# Patient Record
Sex: Female | Born: 1950 | Race: White | Hispanic: Yes | Marital: Single | Smoking: Never smoker
Health system: Southern US, Community
[De-identification: ages and names within clinical notes are randomized; demographics above are authoritative.]

## PROBLEM LIST (undated history)

## (undated) DIAGNOSIS — I10 Essential (primary) hypertension: Secondary | ICD-10-CM

## (undated) DIAGNOSIS — R011 Cardiac murmur, unspecified: Secondary | ICD-10-CM

## (undated) DIAGNOSIS — E7800 Pure hypercholesterolemia, unspecified: Secondary | ICD-10-CM

## (undated) DIAGNOSIS — I350 Nonrheumatic aortic (valve) stenosis: Secondary | ICD-10-CM

## (undated) DIAGNOSIS — R000 Tachycardia, unspecified: Secondary | ICD-10-CM

## (undated) HISTORY — PX: SHOULDER SURGERY: SHX246

## (undated) HISTORY — DX: Nonrheumatic aortic (valve) stenosis: I35.0

## (undated) HISTORY — PX: CARPAL TUNNEL RELEASE: SHX101

---
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
## 2020-04-07 ENCOUNTER — Inpatient Hospital Stay (HOSPITAL_BASED_OUTPATIENT_CLINIC_OR_DEPARTMENT_OTHER): Payer: Medicare Other

## 2020-04-07 ENCOUNTER — Inpatient Hospital Stay (HOSPITAL_COMMUNITY): Payer: Medicare Other

## 2020-04-07 ENCOUNTER — Other Ambulatory Visit: Payer: Self-pay

## 2020-04-07 ENCOUNTER — Emergency Department (HOSPITAL_COMMUNITY): Payer: Medicare Other

## 2020-04-07 ENCOUNTER — Observation Stay (HOSPITAL_COMMUNITY)
Admission: EM | Admit: 2020-04-07 | Discharge: 2020-04-08 | Disposition: A | Discharge status: Home / Self Care | Payer: Medicare Other | Attending: Internal Medicine | Admitting: Internal Medicine

## 2020-04-07 ENCOUNTER — Encounter (HOSPITAL_COMMUNITY): Payer: Self-pay | Admitting: Emergency Medicine

## 2020-04-07 DIAGNOSIS — R002 Palpitations: Secondary | ICD-10-CM | POA: Diagnosis not present

## 2020-04-07 DIAGNOSIS — Z7289 Other problems related to lifestyle: Secondary | ICD-10-CM

## 2020-04-07 DIAGNOSIS — I350 Nonrheumatic aortic (valve) stenosis: Secondary | ICD-10-CM | POA: Diagnosis not present

## 2020-04-07 DIAGNOSIS — Z789 Other specified health status: Secondary | ICD-10-CM

## 2020-04-07 DIAGNOSIS — E7800 Pure hypercholesterolemia, unspecified: Secondary | ICD-10-CM | POA: Diagnosis not present

## 2020-04-07 DIAGNOSIS — Z6831 Body mass index (BMI) 31.0-31.9, adult: Secondary | ICD-10-CM | POA: Diagnosis not present

## 2020-04-07 DIAGNOSIS — Z79899 Other long term (current) drug therapy: Secondary | ICD-10-CM | POA: Insufficient documentation

## 2020-04-07 DIAGNOSIS — Z7982 Long term (current) use of aspirin: Secondary | ICD-10-CM | POA: Diagnosis not present

## 2020-04-07 DIAGNOSIS — R7401 Elevation of levels of liver transaminase levels: Secondary | ICD-10-CM | POA: Insufficient documentation

## 2020-04-07 DIAGNOSIS — I351 Nonrheumatic aortic (valve) insufficiency: Secondary | ICD-10-CM

## 2020-04-07 DIAGNOSIS — I161 Hypertensive emergency: Secondary | ICD-10-CM | POA: Diagnosis not present

## 2020-04-07 DIAGNOSIS — R200 Anesthesia of skin: Secondary | ICD-10-CM | POA: Diagnosis not present

## 2020-04-07 DIAGNOSIS — R531 Weakness: Secondary | ICD-10-CM | POA: Diagnosis present

## 2020-04-07 DIAGNOSIS — I63312 Cerebral infarction due to thrombosis of left middle cerebral artery: Secondary | ICD-10-CM

## 2020-04-07 DIAGNOSIS — R42 Dizziness and giddiness: Secondary | ICD-10-CM

## 2020-04-07 DIAGNOSIS — E785 Hyperlipidemia, unspecified: Secondary | ICD-10-CM | POA: Insufficient documentation

## 2020-04-07 DIAGNOSIS — R55 Syncope and collapse: Secondary | ICD-10-CM | POA: Insufficient documentation

## 2020-04-07 DIAGNOSIS — R079 Chest pain, unspecified: Secondary | ICD-10-CM | POA: Diagnosis not present

## 2020-04-07 DIAGNOSIS — E669 Obesity, unspecified: Secondary | ICD-10-CM | POA: Diagnosis not present

## 2020-04-07 DIAGNOSIS — Z20822 Contact with and (suspected) exposure to covid-19: Secondary | ICD-10-CM | POA: Insufficient documentation

## 2020-04-07 DIAGNOSIS — R4781 Slurred speech: Secondary | ICD-10-CM | POA: Insufficient documentation

## 2020-04-07 DIAGNOSIS — I639 Cerebral infarction, unspecified: Principal | ICD-10-CM | POA: Insufficient documentation

## 2020-04-07 DIAGNOSIS — I10 Essential (primary) hypertension: Secondary | ICD-10-CM | POA: Diagnosis not present

## 2020-04-07 HISTORY — DX: Pure hypercholesterolemia, unspecified: E78.00

## 2020-04-07 HISTORY — DX: Essential (primary) hypertension: I10

## 2020-04-07 HISTORY — DX: Tachycardia, unspecified: R00.0

## 2020-04-07 LAB — RESPIRATORY PANEL BY RT PCR (FLU A&B, COVID)
Influenza A by PCR: NEGATIVE
Influenza B by PCR: NEGATIVE
SARS Coronavirus 2 by RT PCR: NEGATIVE

## 2020-04-07 LAB — COMPREHENSIVE METABOLIC PANEL
ALT: 36 U/L (ref 0–44)
AST: 46 U/L — ABNORMAL HIGH (ref 15–41)
Albumin: 4.4 g/dL (ref 3.5–5.0)
Alkaline Phosphatase: 76 U/L (ref 38–126)
Anion gap: 12 (ref 5–15)
BUN: 12 mg/dL (ref 8–23)
CO2: 22 mmol/L (ref 22–32)
Calcium: 9.8 mg/dL (ref 8.9–10.3)
Chloride: 102 mmol/L (ref 98–111)
Creatinine, Ser: 0.88 mg/dL (ref 0.44–1.00)
GFR calc Af Amer: >60 mL/min (ref >60)
GFR calc non Af Amer: >60 mL/min (ref >60)
Glucose, Bld: 102 mg/dL — ABNORMAL HIGH (ref 70–99)
Potassium: 3.8 mmol/L (ref 3.5–5.1)
Sodium: 136 mmol/L (ref 135–145)
Total Bilirubin: 0.7 mg/dL (ref 0.3–1.2)
Total Protein: 8 g/dL (ref 6.5–8.1)

## 2020-04-07 LAB — URINALYSIS, ROUTINE W REFLEX MICROSCOPIC
Bilirubin Urine: NEGATIVE
Glucose, UA: NEGATIVE mg/dL
Hgb urine dipstick: NEGATIVE
Ketones, ur: NEGATIVE mg/dL
Leukocytes,Ua: NEGATIVE
Nitrite: NEGATIVE
Protein, ur: NEGATIVE mg/dL
Specific Gravity, Urine: 1.006 (ref 1.005–1.030)
pH: 5 (ref 5.0–8.0)

## 2020-04-07 LAB — DIFFERENTIAL
Abs Immature Granulocytes: 0.03 10*3/uL (ref 0.00–0.07)
Basophils Absolute: 0 10*3/uL (ref 0.0–0.1)
Basophils Relative: 0 %
Eosinophils Absolute: 0.2 10*3/uL (ref 0.0–0.5)
Eosinophils Relative: 3 %
Immature Granulocytes: 0 %
Lymphocytes Relative: 29 %
Lymphs Abs: 2.4 10*3/uL (ref 0.7–4.0)
Monocytes Absolute: 0.7 10*3/uL (ref 0.1–1.0)
Monocytes Relative: 9 %
Neutro Abs: 4.9 10*3/uL (ref 1.7–7.7)
Neutrophils Relative %: 59 %

## 2020-04-07 LAB — ECHOCARDIOGRAM COMPLETE
Height: 61 in
Weight: 2645.52 oz

## 2020-04-07 LAB — LIPID PANEL
Cholesterol: 312 mg/dL — ABNORMAL HIGH (ref 0–200)
HDL: 57 mg/dL (ref >40)
LDL Cholesterol: UNDETERMINED mg/dL (ref 0–99)
Total CHOL/HDL Ratio: 5.5 RATIO
Triglycerides: 787 mg/dL — ABNORMAL HIGH (ref <150)
VLDL: UNDETERMINED mg/dL (ref 0–40)

## 2020-04-07 LAB — CBC
HCT: 39.9 % (ref 36.0–46.0)
Hemoglobin: 14 g/dL (ref 12.0–15.0)
MCH: 29.9 pg (ref 26.0–34.0)
MCHC: 35.1 g/dL (ref 30.0–36.0)
MCV: 85.1 fL (ref 80.0–100.0)
Platelets: 172 10*3/uL (ref 150–400)
RBC: 4.69 MIL/uL (ref 3.87–5.11)
RDW: 11.3 % — ABNORMAL LOW (ref 11.5–15.5)
WBC: 8.2 10*3/uL (ref 4.0–10.5)
nRBC: 0 % (ref 0.0–0.2)

## 2020-04-07 LAB — I-STAT CHEM 8, ED
BUN: 14 mg/dL (ref 8–23)
Calcium, Ion: 1.15 mmol/L (ref 1.15–1.40)
Chloride: 104 mmol/L (ref 98–111)
Creatinine, Ser: 0.8 mg/dL (ref 0.44–1.00)
Glucose, Bld: 95 mg/dL (ref 70–99)
HCT: 41 % (ref 36.0–46.0)
Hemoglobin: 13.9 g/dL (ref 12.0–15.0)
Potassium: 3.7 mmol/L (ref 3.5–5.1)
Sodium: 137 mmol/L (ref 135–145)
TCO2: 24 mmol/L (ref 22–32)

## 2020-04-07 LAB — PROTIME-INR
INR: 1 (ref 0.8–1.2)
Prothrombin Time: 13.2 seconds (ref 11.4–15.2)

## 2020-04-07 LAB — RAPID URINE DRUG SCREEN, HOSP PERFORMED
Amphetamines: NOT DETECTED
Barbiturates: NOT DETECTED
Benzodiazepines: NOT DETECTED
Cocaine: NOT DETECTED
Opiates: NOT DETECTED
Tetrahydrocannabinol: NOT DETECTED

## 2020-04-07 LAB — MAGNESIUM: Magnesium: 2.2 mg/dL (ref 1.7–2.4)

## 2020-04-07 LAB — HEMOGLOBIN A1C
Hgb A1c MFr Bld: 5.3 % (ref 4.8–5.6)
Mean Plasma Glucose: 105.41 mg/dL

## 2020-04-07 LAB — CBG MONITORING, ED: Glucose-Capillary: 93 mg/dL (ref 70–99)

## 2020-04-07 LAB — LDL CHOLESTEROL, DIRECT: Direct LDL: 126.6 mg/dL — ABNORMAL HIGH (ref 0–99)

## 2020-04-07 LAB — APTT: aPTT: 32 seconds (ref 24–36)

## 2020-04-07 LAB — HIV ANTIBODY (ROUTINE TESTING W REFLEX): HIV Screen 4th Generation wRfx: NONREACTIVE

## 2020-04-07 LAB — ETHANOL: Alcohol, Ethyl (B): <10 mg/dL (ref <10)

## 2020-04-07 IMAGING — CT CT HEAD CODE STROKE
2 of 3 series · 14 of 47 positions shown, 17 images · non-contrast
Comparison: None.

CLINICAL DATA: Code stroke. Right-sided weakness and slurred speech

EXAM:
CT HEAD WITHOUT CONTRAST
TECHNIQUE: Contiguous axial images were obtained from the base of the skull
through the vertex without intravenous contrast.

[Series 3: head 5.0 st · axial · 0.43mm/px · z∈[-127,+13]mm · 11 of 34 slices shown, 14 images]
[im 3/34  brain]
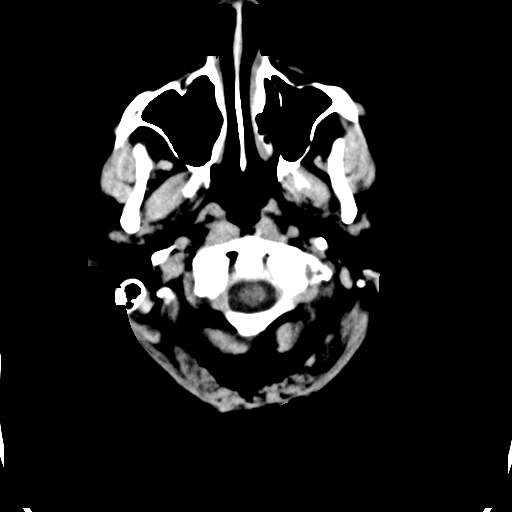
[im 3/34  bone]
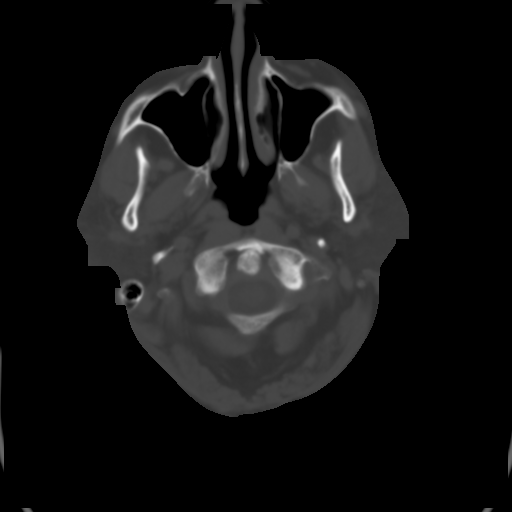
[im 5/34  brain]
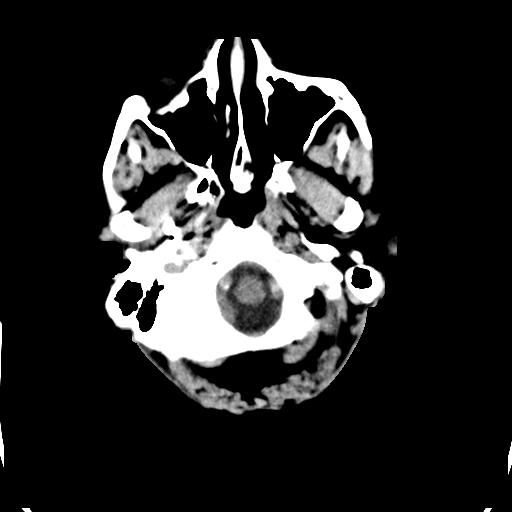
[im 8/34  brain]
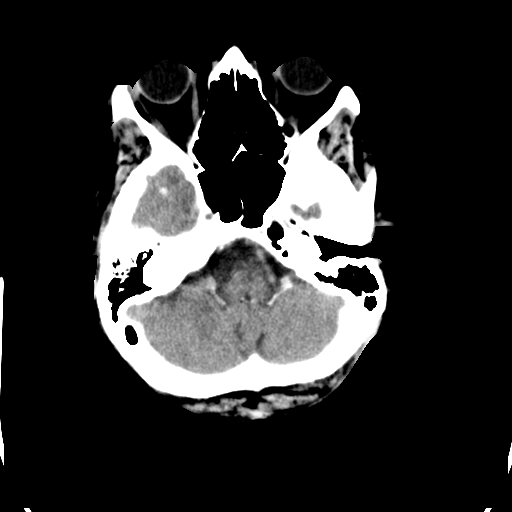
[im 11/34  brain]
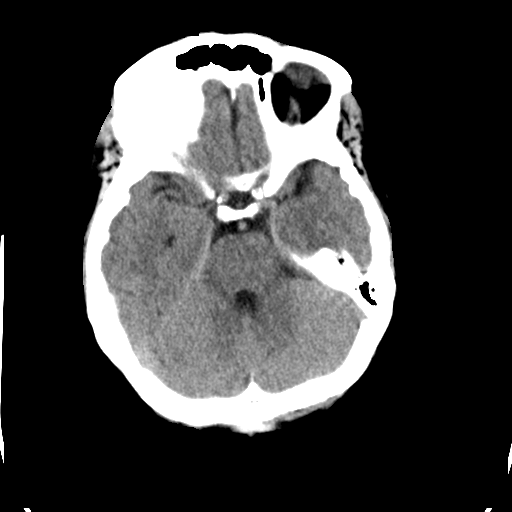
[im 14/34  brain]
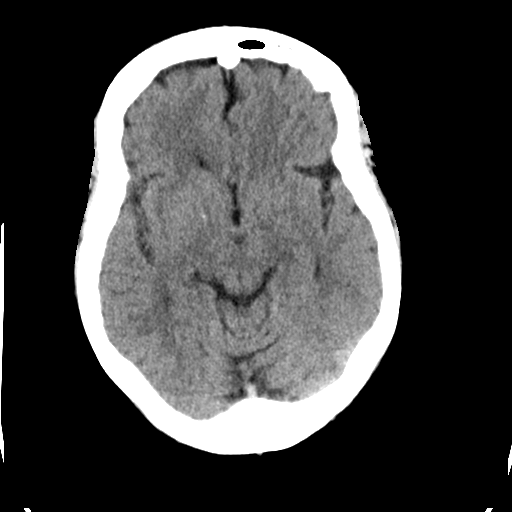
[im 14/34  bone]
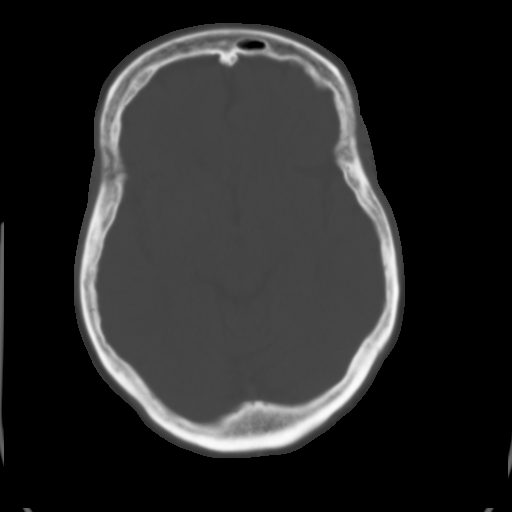
[im 18/34  brain]
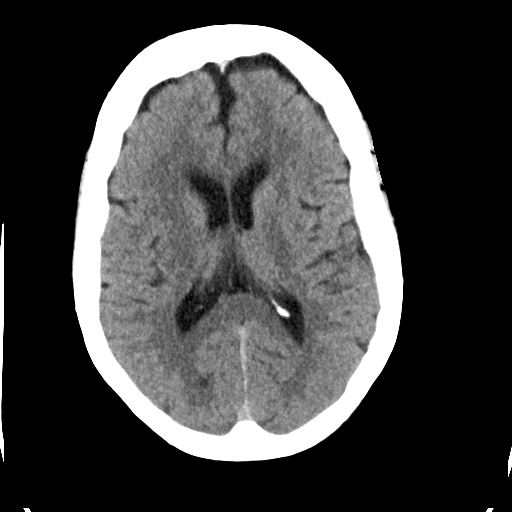
[im 20/34  brain]
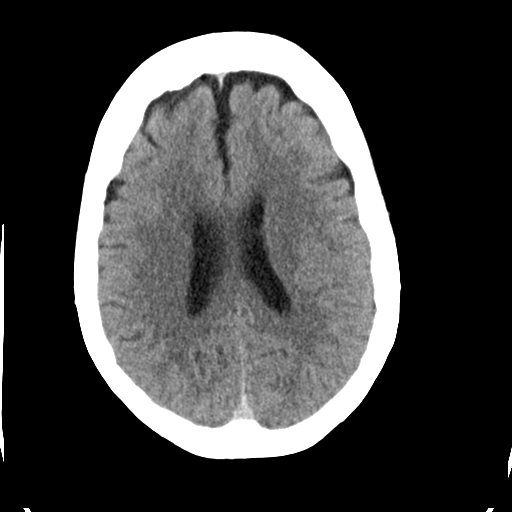
[im 23/34  brain]
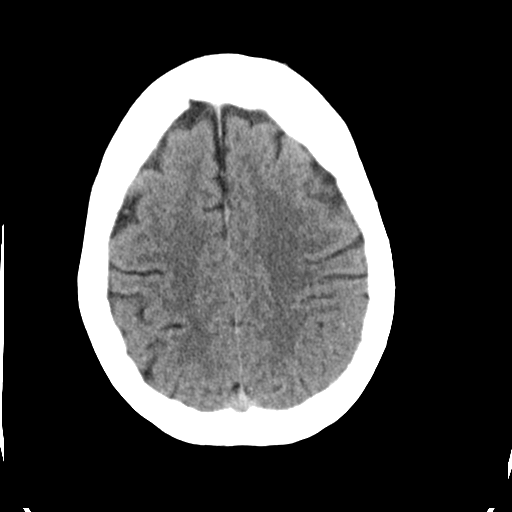
[im 26/34  brain]
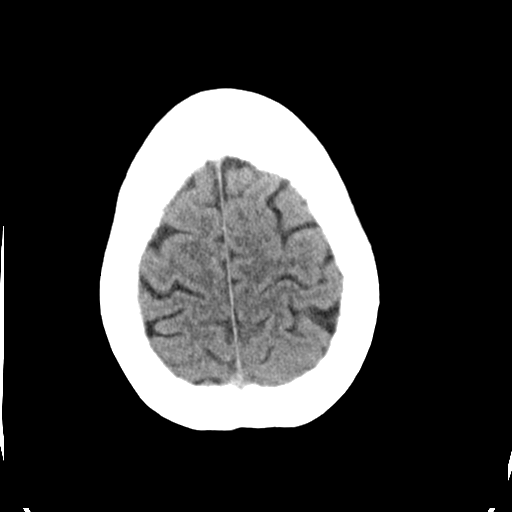
[im 26/34  bone]
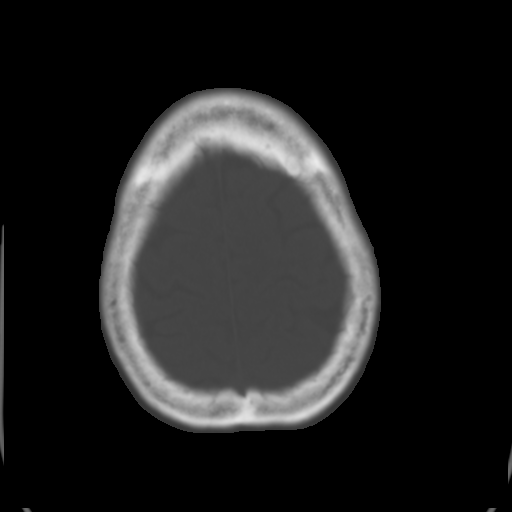
[im 29/34  brain]
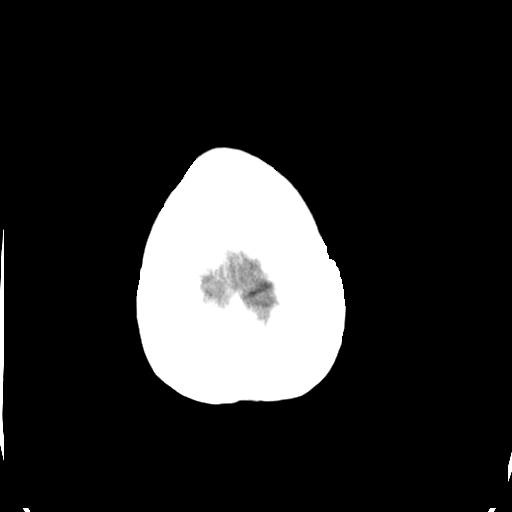
[im 31/34  brain]
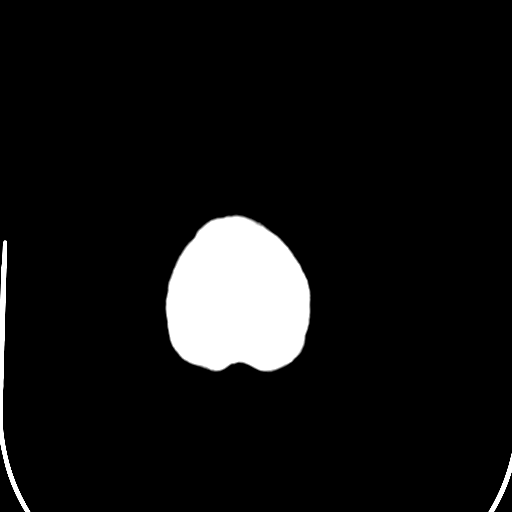

[Series 5: head 3.0 cor st · coronal · 0.33mm/px · 3 of 74 slices shown]
[im 25/74  brain]
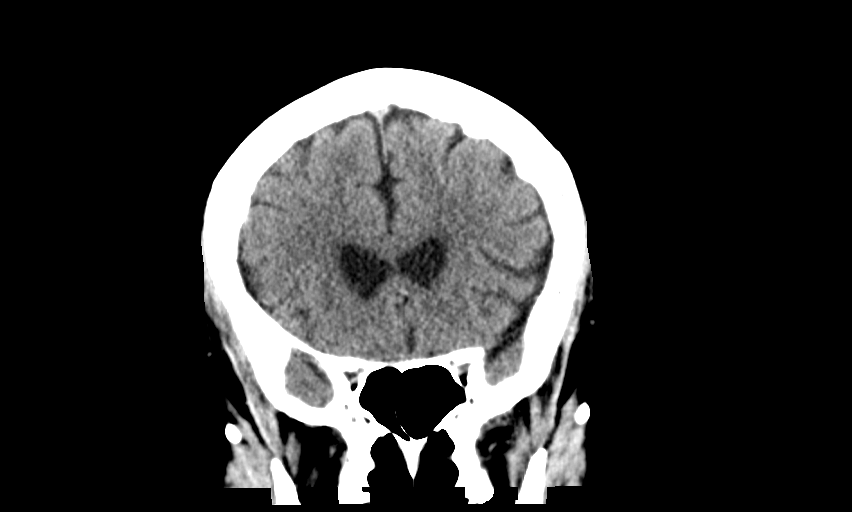
[im 33/74  brain]
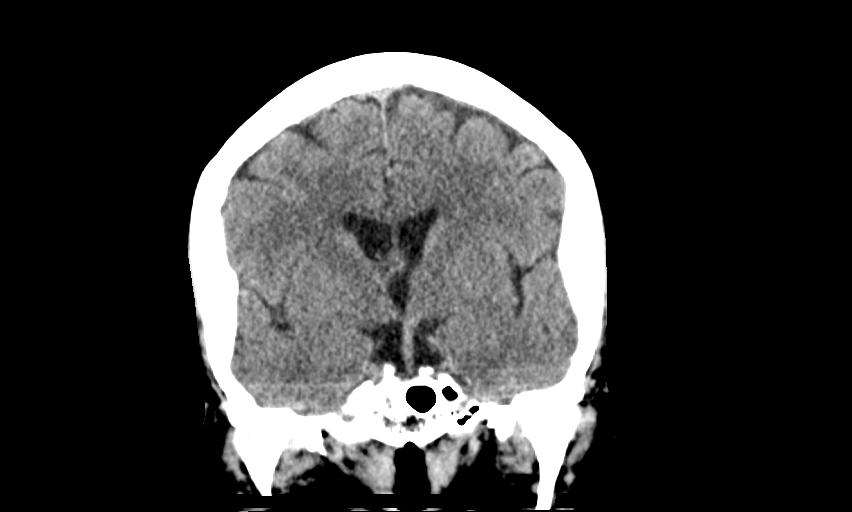
[im 41/74  brain]
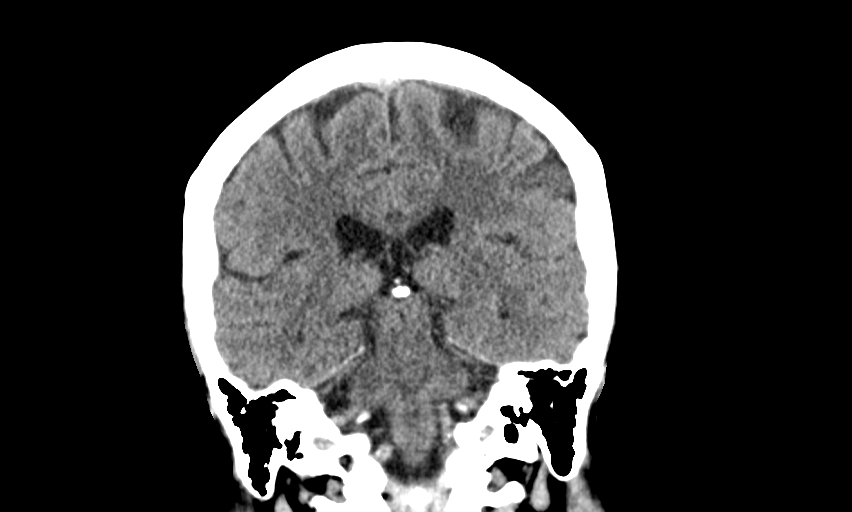

[14 of 47 positions shown; findings below may reference images not displayed]

FINDINGS: Brain: There is no mass, hemorrhage or extra-axial collection. The
size and configuration of the ventricles and extra-axial CSF spaces
are normal. The brain parenchyma is normal, without evidence of
acute or chronic infarction.

Vascular: No abnormal hyperdensity of the major intracranial
arteries or dural venous sinuses. No intracranial atherosclerosis.

Skull: The visualized skull base, calvarium and extracranial soft
tissues are normal.

Sinuses/Orbits: No fluid levels or advanced mucosal thickening of
the visualized paranasal sinuses. No mastoid or middle ear effusion.
The orbits are normal.

ASPECTS (Alberta Stroke Program Early CT Score)

- Ganglionic level infarction (caudate, lentiform nuclei, internal
capsule, insula, M1-M3 cortex): 7

- Supraganglionic infarction (M4-M6 cortex): 3

Total score (0-10 with 10 being normal): 10
IMPRESSION: 1. Normal head CT.
2. ASPECTS is 10.
* These results were communicated to Dr. ROVANI at [DATE] on [DATE] by text page via the [employer] messaging system.

## 2020-04-07 IMAGING — CR DG CHEST 2V
2 series · 2 of 2 positions shown · non-contrast
Comparison: None.

CLINICAL DATA: Slurred speech lightheaded

EXAM:
CHEST - 2 VIEW

[chest lat]
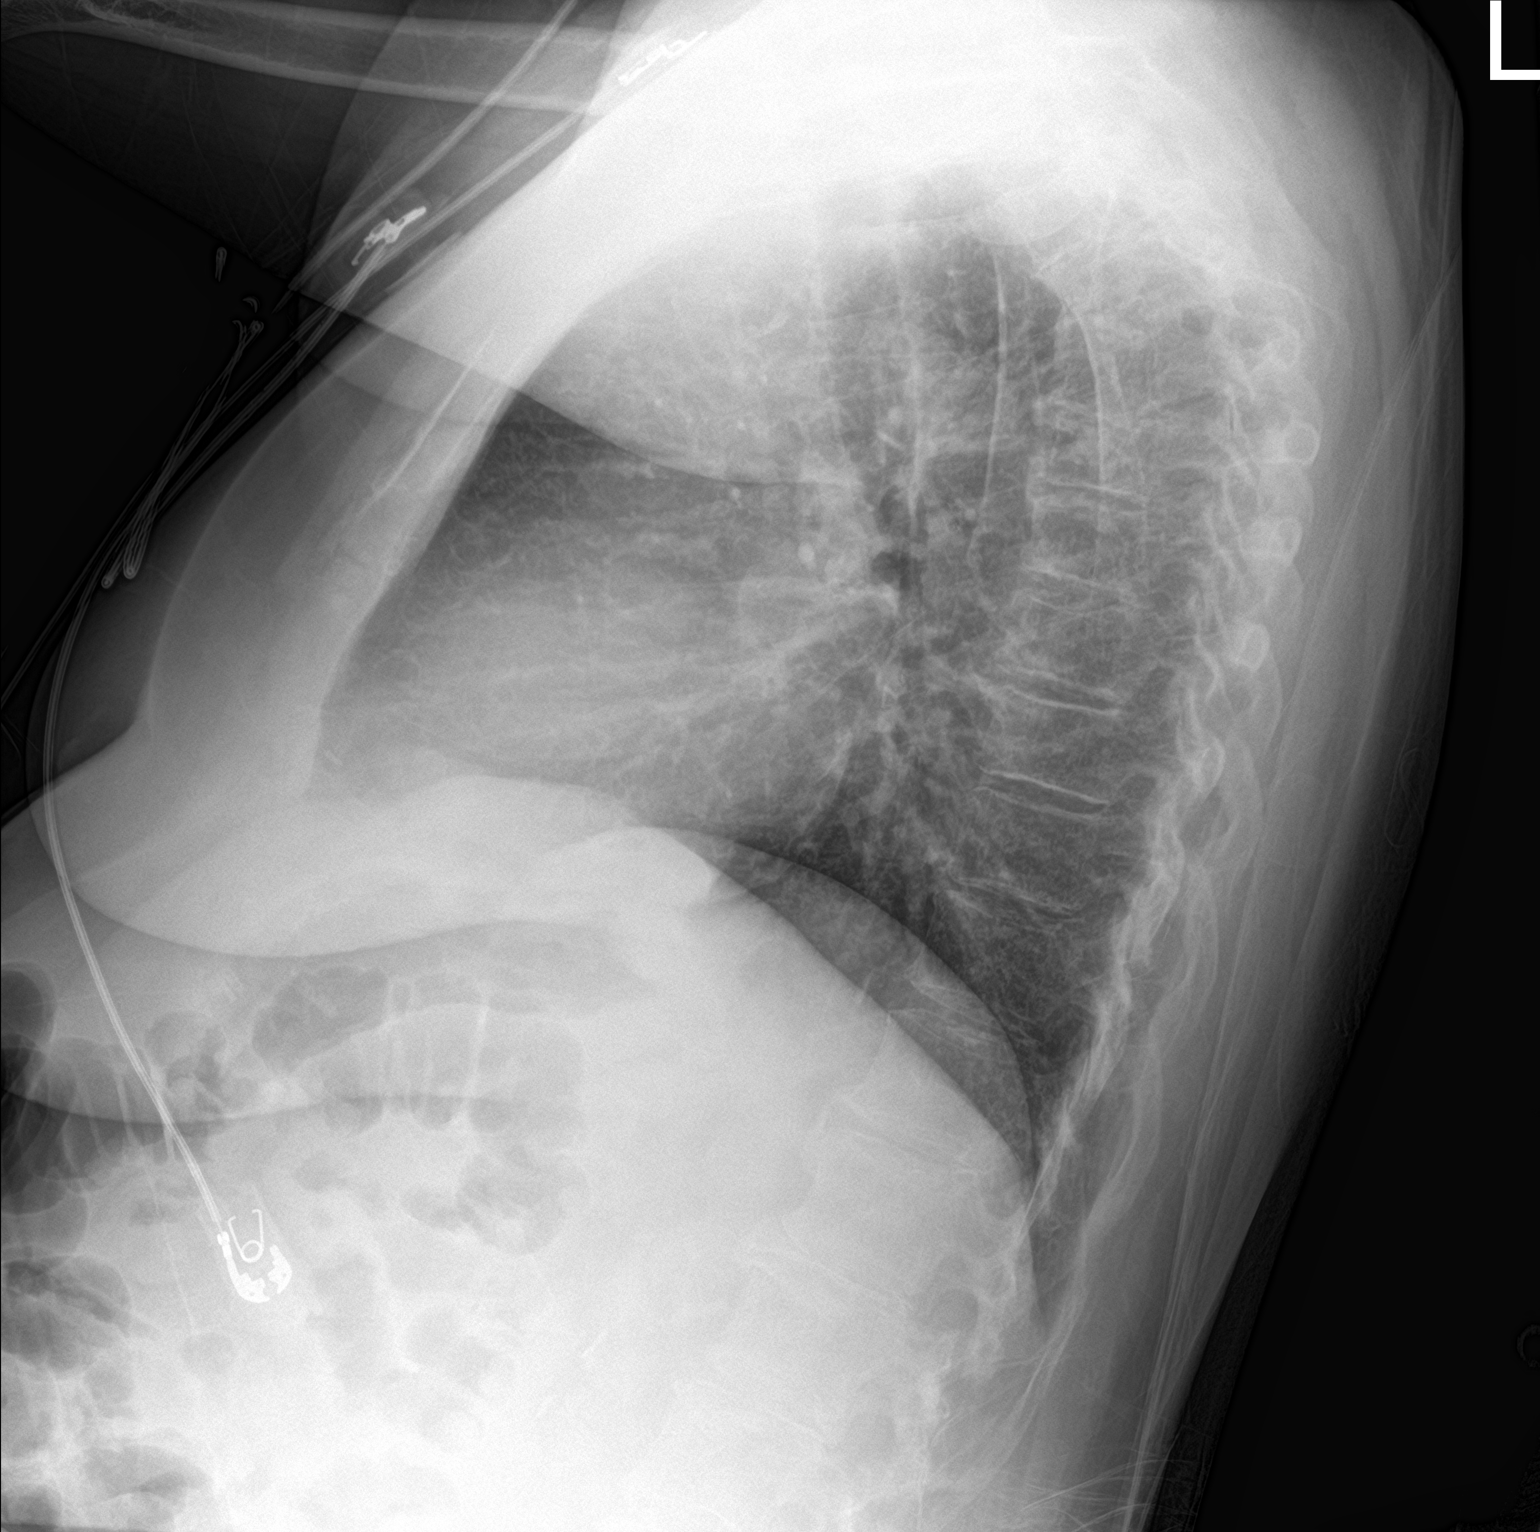

[chest ap]
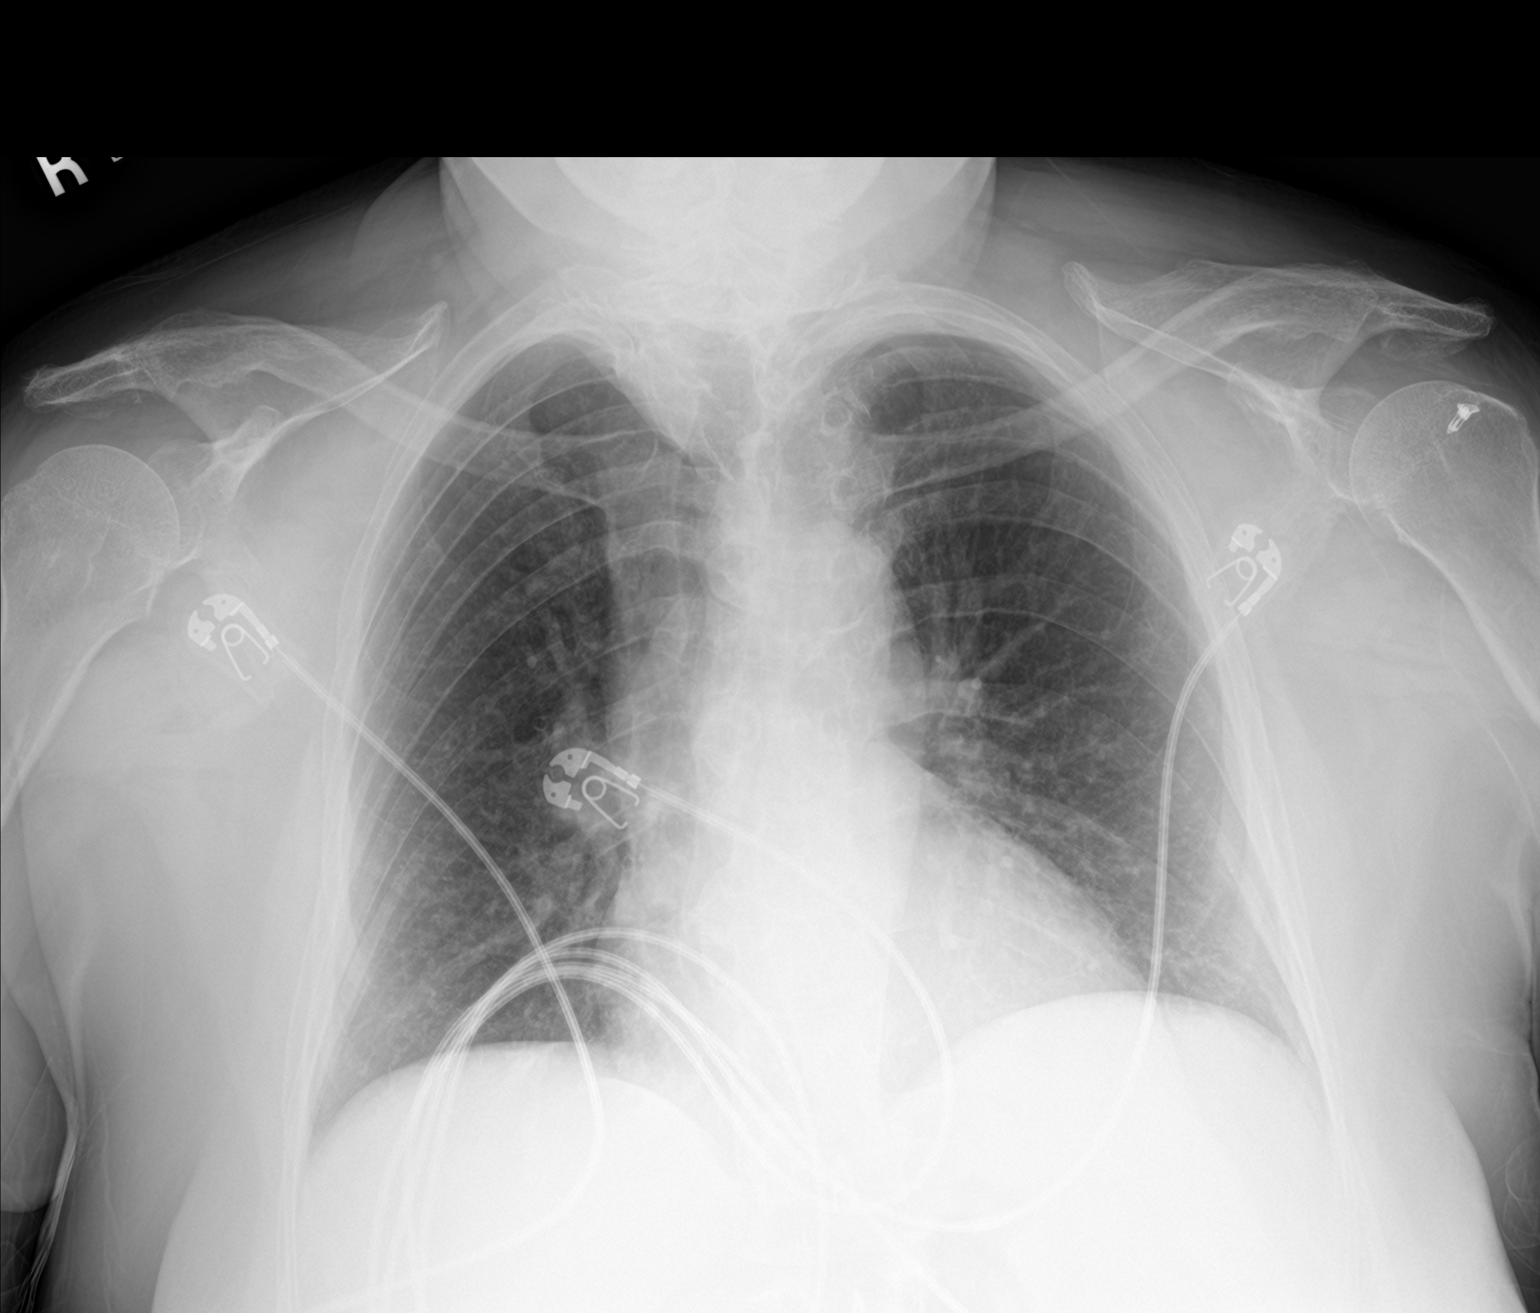

[2 of 2 positions shown; findings below may reference images not displayed]

FINDINGS: The heart size and mediastinal contours are within normal limits.
Both lungs are clear. Likely chronic mild interstitial prominence.
No pleural effusion or pneumothorax. The visualized skeletal
structures are unremarkable.
IMPRESSION: No acute process in the chest.

## 2020-04-07 IMAGING — MR MR MRA NECK WO/W CM
7 of 9 series · 42 of 48 positions shown · IV contrast (Gadavist)
Comparison: None.

CLINICAL DATA: Right-sided weakness with slurred speech

EXAM:
MR HEAD WITHOUT CONTRAST
MR CIRCLE OF WILLIS WITHOUT CONTRAST
MRA OF THE NECK WITHOUT AND WITH CONTRAST
TECHNIQUE: Multiplanar, multiecho pulse sequences of the brain, circle of
willis and surrounding structures were obtained without intravenous
contrast. Angiographic images of the neck were obtained using MRA
technique without and with intravenous contrast.
CONTRAST:  7.5mL GADAVIST GADOBUTROL 1 MMOL/ML IV SOLN

[Series 7: tof_fl3d_tra_iso · axial · 0.6mm · 0.52mm/px · z∈[-190,-112]mm · 8 of 133 slices shown]
[im 1/133]
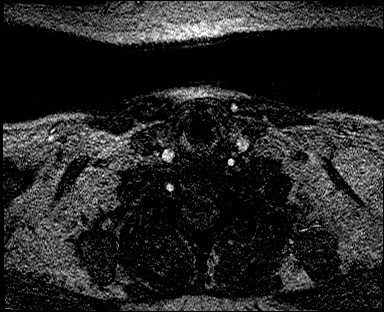
[im 19/133]
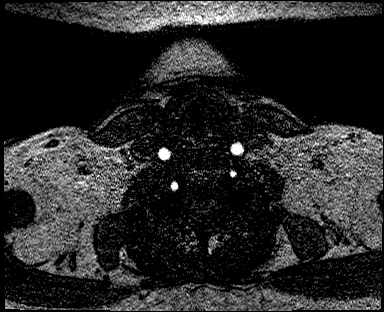
[im 38/133]
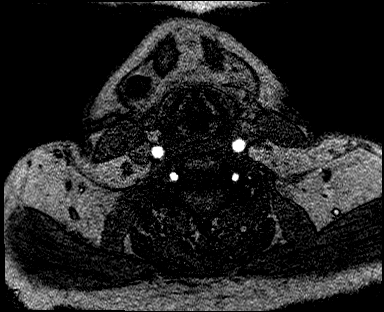
[im 57/133]
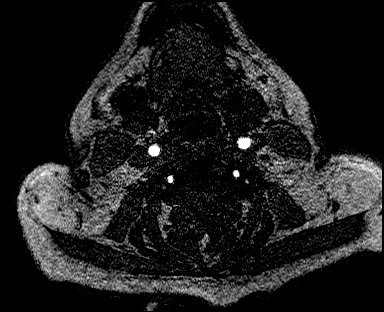
[im 76/133]
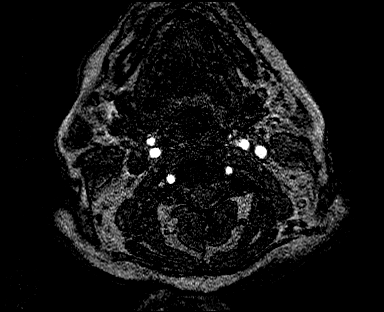
[im 95/133]
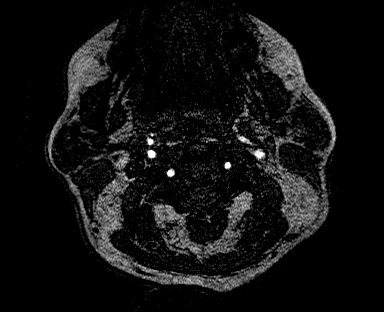
[im 114/133]
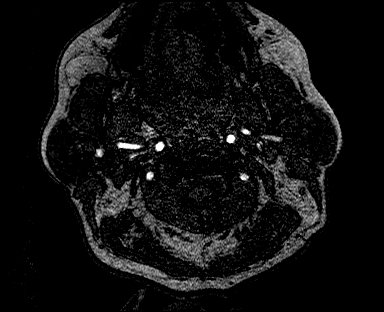
[im 133/133]
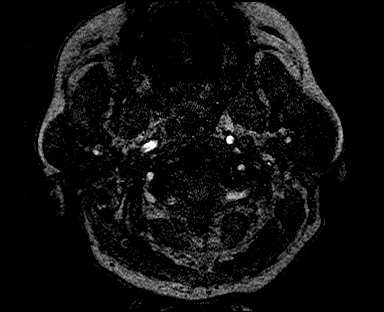

[Series 10: angio_fl3d_cor_pre_ttc=3.0s · coronal · 0.9mm · 0.85mm/px · 5 of 80 slices shown]
[im 1/80]
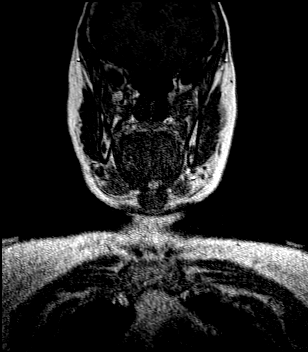
[im 20/80]
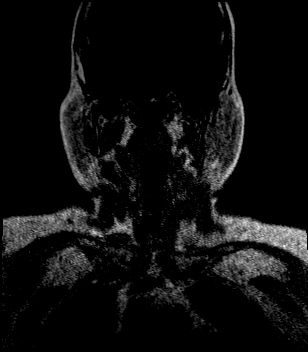
[im 40/80]
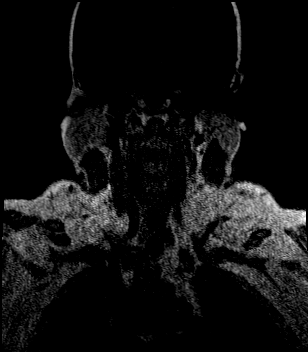
[im 60/80]
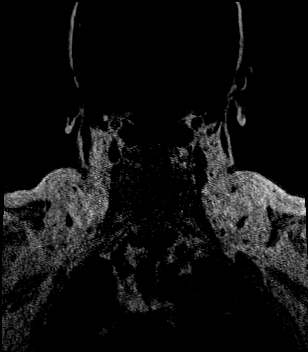
[im 80/80]
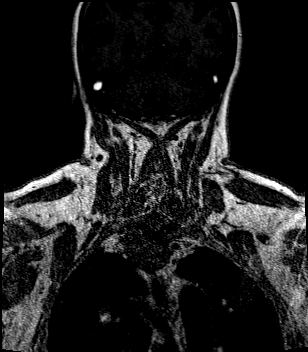

[Series 12: angio_fl3d_cor_post_ttc=3.0s · coronal · 0.9mm · 0.85mm/px · 6 of 80 slices shown (1 of 2)]
[im 1/80]
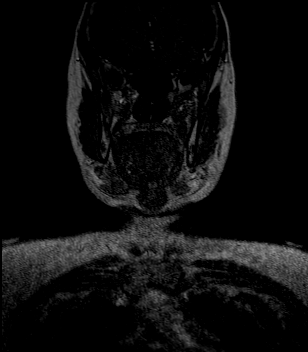
[im 16/80]
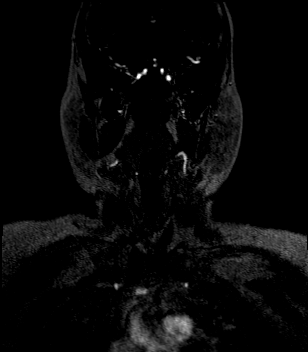
[im 32/80]
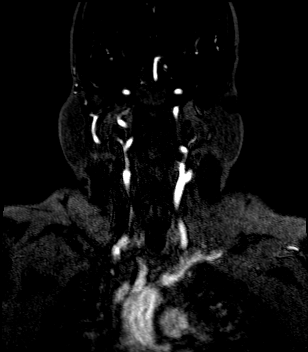
[im 48/80]
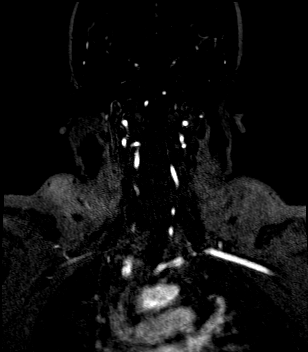
[im 64/80]
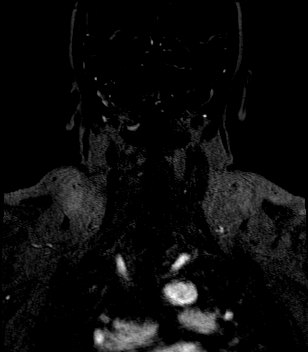
[im 80/80]
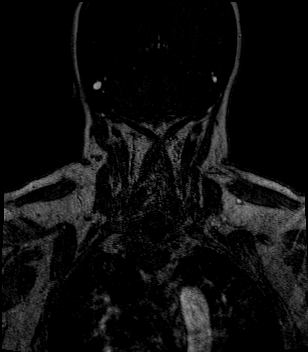

[Series 13: angio_fl3d_cor_post_ttc=3.0s_moco-adv · coronal · 0.9mm · 0.85mm/px · 6 of 80 slices shown (1 of 2)]
[im 1/80]
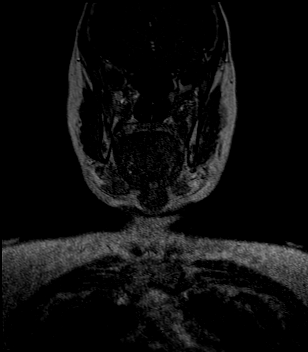
[im 16/80]
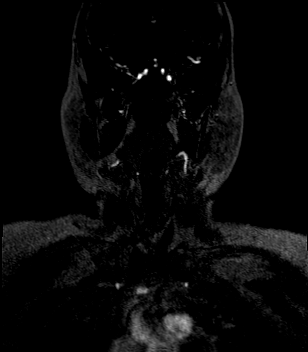
[im 32/80]
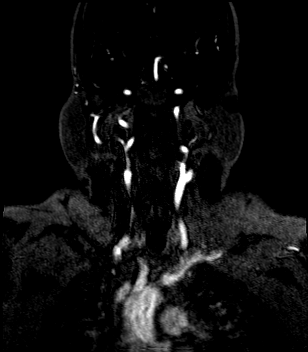
[im 48/80]
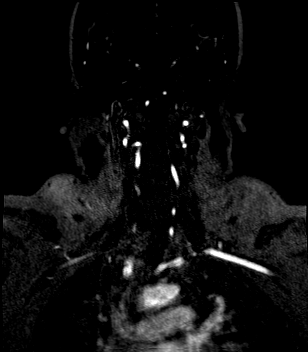
[im 64/80]
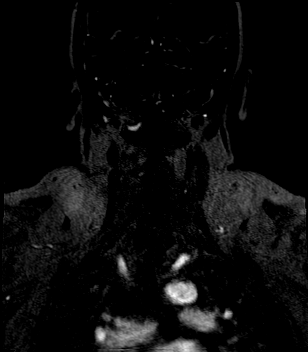
[im 80/80]
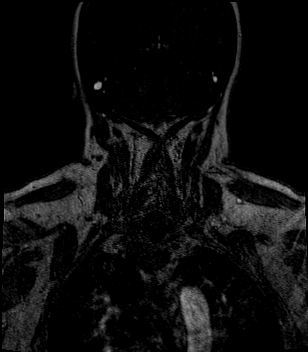

[Series 14: angio_fl3d_cor_post_ttc=3.0s_moco-adv_sub · coronal · 0.9mm · 0.85mm/px · 5 of 79 slices shown]
[im 1/79]
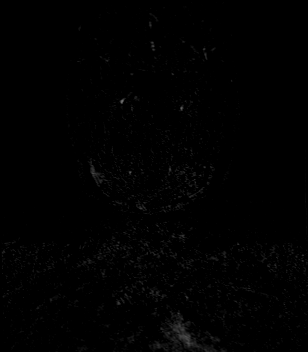
[im 20/79]
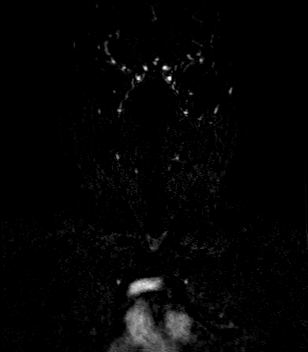
[im 40/79]
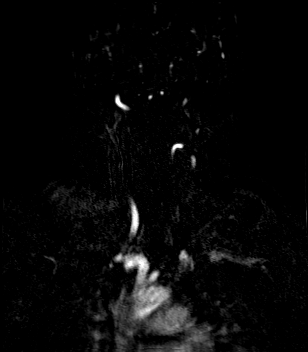
[im 59/79]
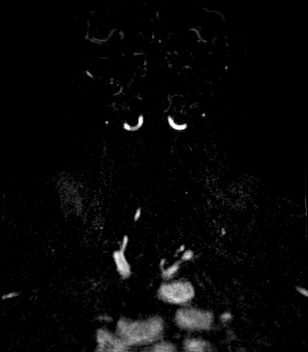
[im 79/79]
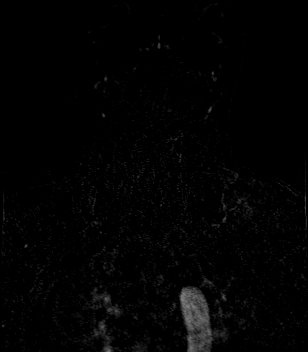

[Series 16: angio_fl3d_cor_post_ttc=3.0s · coronal · 0.9mm · 0.85mm/px · 6 of 80 slices shown (2 of 2)]
[im 1/80]
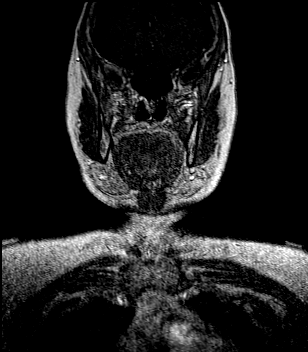
[im 16/80]
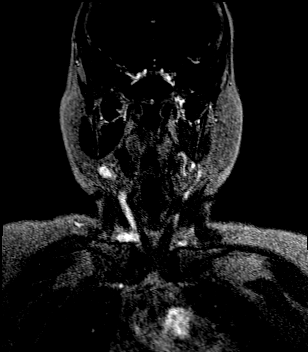
[im 32/80]
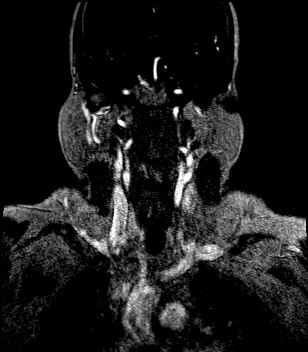
[im 48/80]
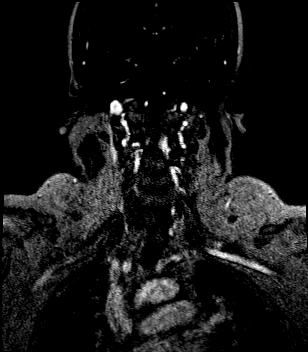
[im 64/80]
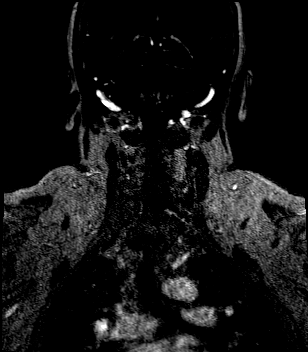
[im 80/80]
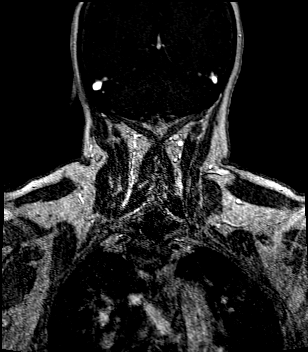

[Series 17: angio_fl3d_cor_post_ttc=3.0s_moco-adv · coronal · 0.9mm · 0.85mm/px · 6 of 80 slices shown (2 of 2)]
[im 1/80]
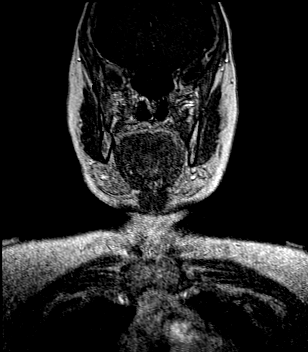
[im 16/80]
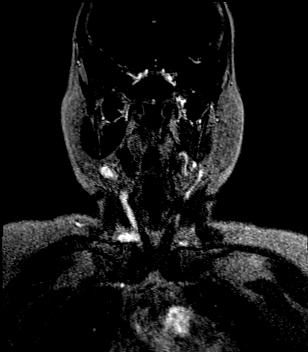
[im 32/80]
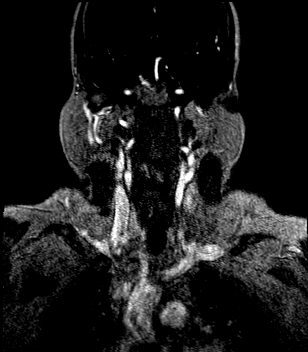
[im 48/80]
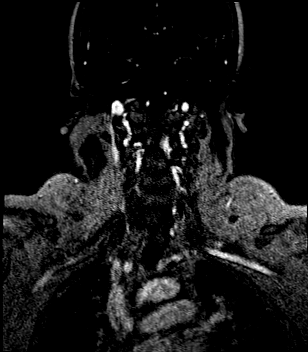
[im 64/80]
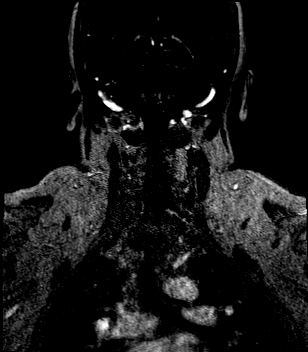
[im 80/80]
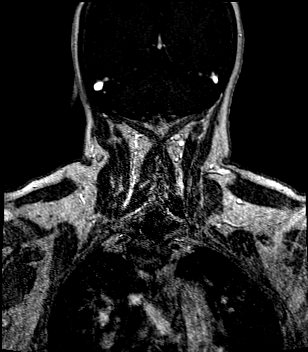

[42 of 48 positions shown; findings below may reference images not displayed]

FINDINGS: MRI HEAD FINDINGS

BRAIN: Faint area of abnormal diffusion restriction at the left
caudate tail. Multifocal white matter hyperintensity, most commonly
due to chronic ischemic microangiopathy. Midline structures are
normal. The CSF spaces are normal for age, with no hydrocephalus.
Blood-sensitive sequences show no chronic microhemorrhage or
superficial siderosis.

SKULL AND UPPER CERVICAL SPINE: The visualized skull base,
calvarium, upper cervical spine and extracranial soft tissues are
normal.

SINUSES/ORBITS: No fluid levels or advanced mucosal thickening. No
mastoid or middle ear effusion. The orbits are normal.

MRA HEAD FINDINGS

POSTERIOR CIRCULATION:

--Basilar artery: Normal.

--Posterior cerebral arteries: Normal. Both originate from the
basilar artery.

--Superior cerebellar arteries: Normal.

--Inferior cerebellar arteries: Normal anterior and posterior
inferior cerebellar arteries.

ANTERIOR CIRCULATION:

--Intracranial internal carotid arteries: Normal.

--Anterior cerebral arteries: Normal. Both A1 segments are present.
Patent anterior communicating artery.

--Middle cerebral arteries: Normal.

--Posterior communicating arteries: Diminutive or absent
bilaterally.

MRA NECK FINDINGS

Aortic arch: Normal 3 vessel aortic branching pattern. The
visualized subclavian arteries are normal.

Right carotid system: Normal course and caliber without stenosis or
evidence of dissection.

Left carotid system: Normal course and caliber without stenosis or
evidence of dissection.

Vertebral arteries: Left dominant. Vertebral artery origins are
normal. Vertebral arteries are normal in course and caliber to the
vertebrobasilar confluence without stenosis or evidence of
dissection.
IMPRESSION: 1. Small focus of acute/subacute ischemia at the left caudate tail.
No hemorrhage or mass effect.
2. Normal intracranial and cervical MRA.

## 2020-04-07 IMAGING — MR MR HEAD W/O CM
12 of 13 series · 44 of 48 positions shown · IV contrast (gadavist)
Comparison: None.

CLINICAL DATA: Right-sided weakness with slurred speech

EXAM:
MR HEAD WITHOUT CONTRAST
MR CIRCLE OF WILLIS WITHOUT CONTRAST
MRA OF THE NECK WITHOUT AND WITH CONTRAST
TECHNIQUE: Multiplanar, multiecho pulse sequences of the brain, circle of
willis and surrounding structures were obtained without intravenous
contrast. Angiographic images of the neck were obtained using MRA
technique without and with intravenous contrast.
CONTRAST:  7.5mL GADAVIST GADOBUTROL 1 MMOL/ML IV SOLN

[Series 5: DWI · axial · 3.0mm · 0.88mm/px · z∈[-105,+30]mm · 8 of 92 slices shown (1 of 4)]
[im 1/92]
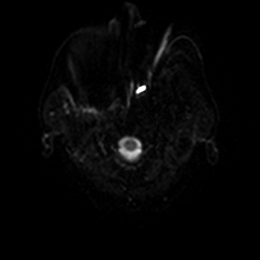
[im 14/92]
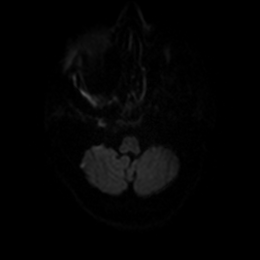
[im 27/92]
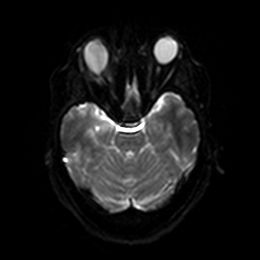
[im 40/92]
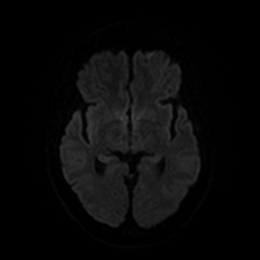
[im 53/92]
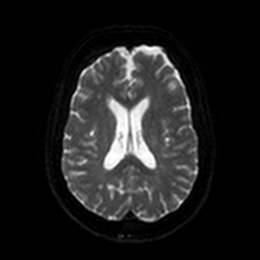
[im 66/92]
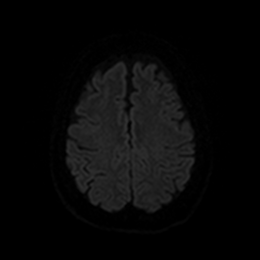
[im 79/92]
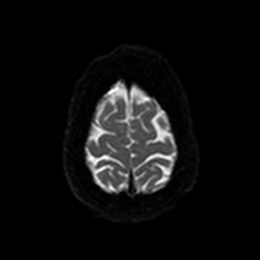
[im 92/92]
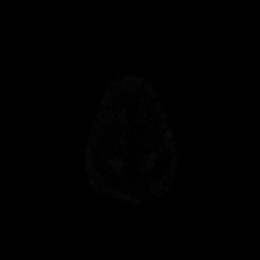

[Series 6: DWI · axial · 3.0mm · 0.88mm/px · z∈[-105,+30]mm · 4 of 45 slices shown (2 of 4)]
[im 1/45]
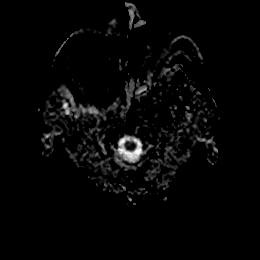
[im 15/45]
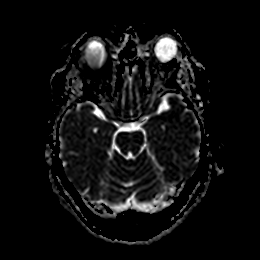
[im 30/45]
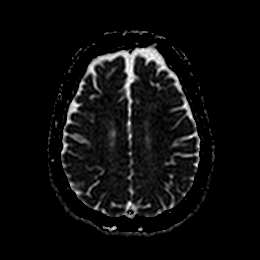
[im 45/45]
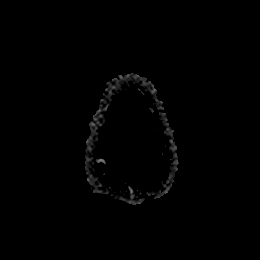

[Series 7: T1 · sagittal · 5.0mm · 0.75mm/px · 2 of 25 slices shown]
[im 1/25]
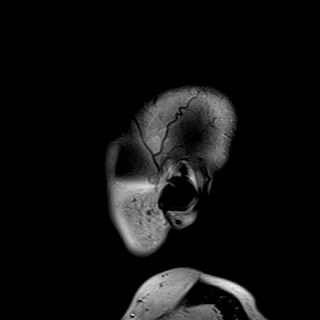
[im 25/25]
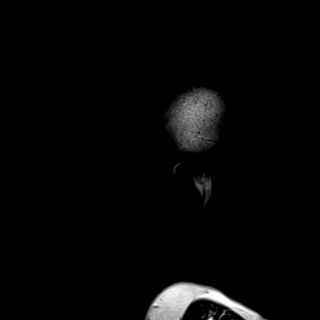

[Series 10: T2 · axial · 5.0mm · 0.72mm/px · z∈[-116,+27]mm · 2 of 25 slices shown (1 of 2)]
[im 1/25]
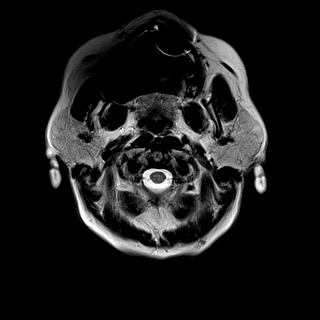
[im 25/25]
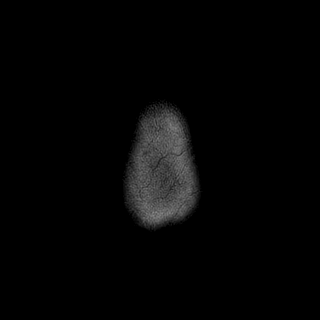

[Series 11: FLAIR · axial · 5.0mm · 0.45mm/px · z∈[-116,+27]mm · 2 of 25 slices shown]
[im 1/25]
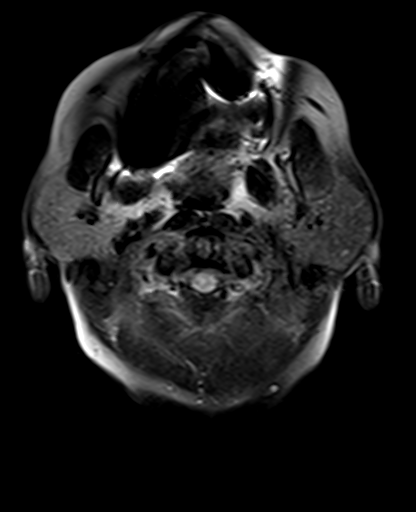
[im 25/25]
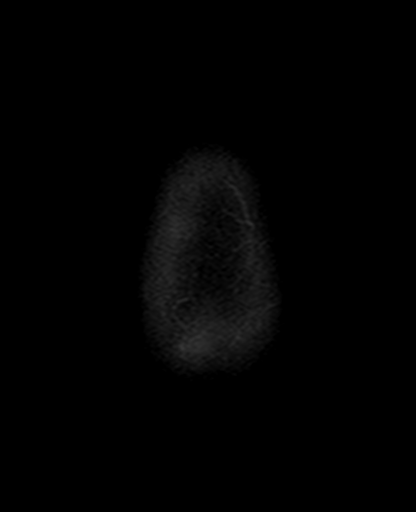

[Series 16: mag_images · axial · 3.0mm · 0.90mm/px · z∈[-123,+29]mm · 4 of 52 slices shown]
[im 1/52]
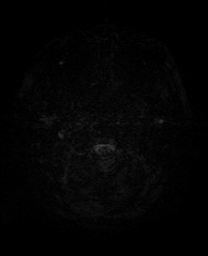
[im 18/52]
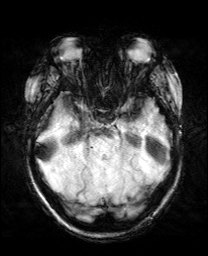
[im 35/52]
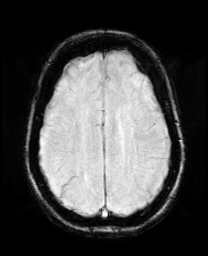
[im 52/52]
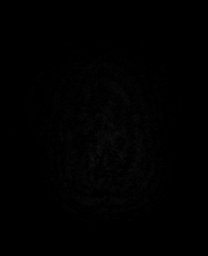

[Series 17: pha_images · axial · 3.0mm · 0.90mm/px · z∈[-123,+29]mm · 4 of 52 slices shown]
[im 1/52]
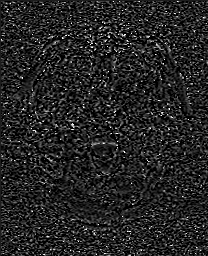
[im 18/52]
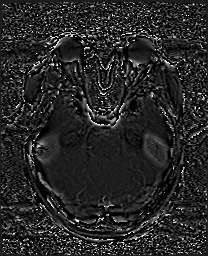
[im 35/52]
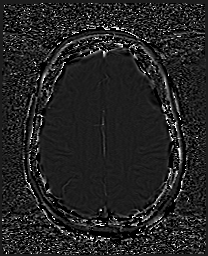
[im 52/52]
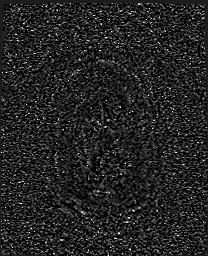

[Series 18: swi_images · axial · 3.0mm · 0.90mm/px · z∈[-123,+29]mm · 4 of 52 slices shown]
[im 1/52]
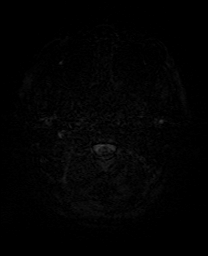
[im 18/52]
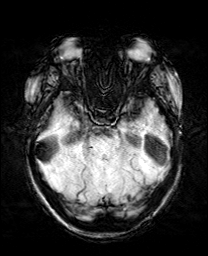
[im 35/52]
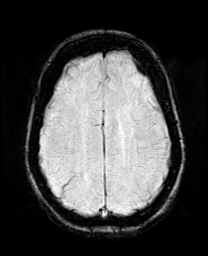
[im 52/52]
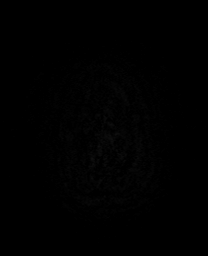

[Series 19: mip_images(sw) · axial · 24.0mm · 0.90mm/px · z∈[-112,+18]mm · 4 of 45 slices shown]
[im 1/45]
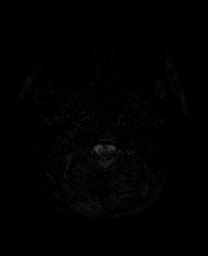
[im 15/45]
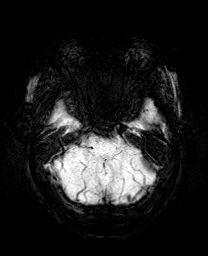
[im 30/45]
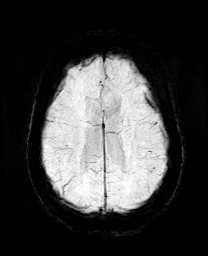
[im 45/45]
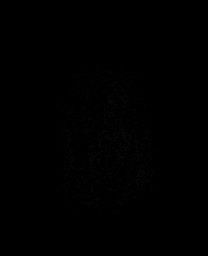

[Series 21: T2 · coronal · 5.0mm · 0.34mm/px · 2 of 29 slices shown (2 of 2)]
[im 1/29]
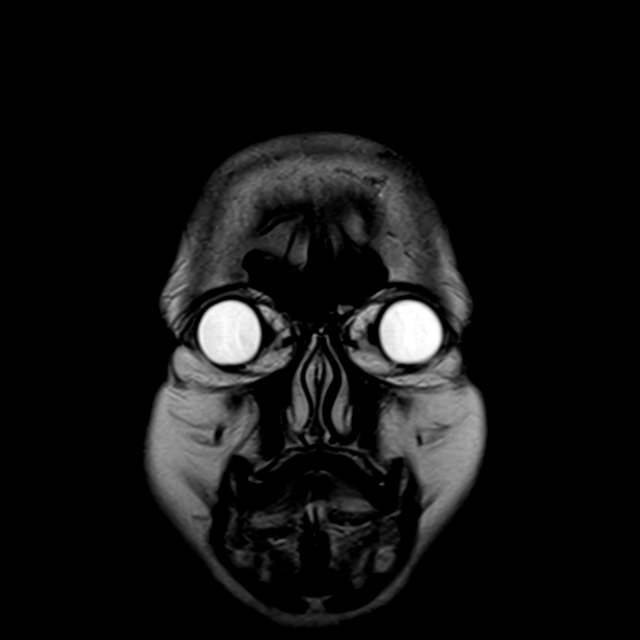
[im 29/29]
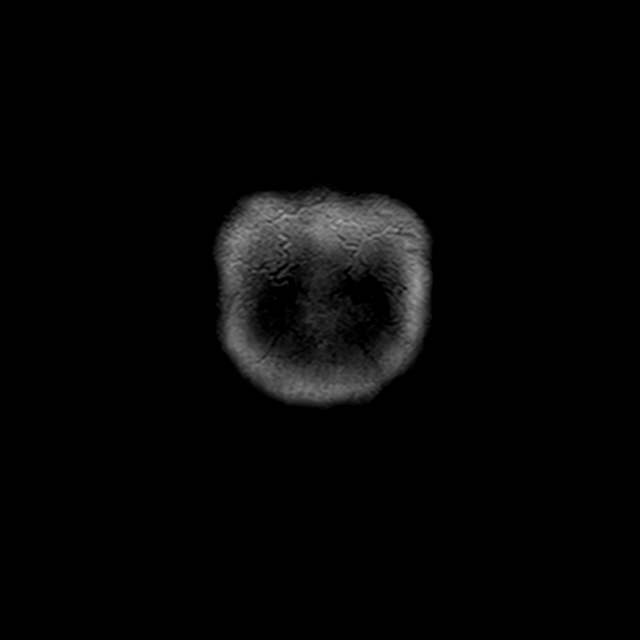

[Series 22: DWI · coronal · 4.0mm · 0.88mm/px · 5 of 64 slices shown (3 of 4)]
[im 1/64]
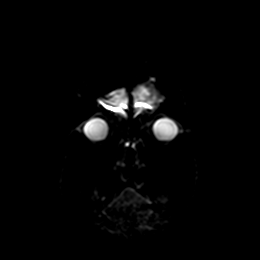
[im 16/64]
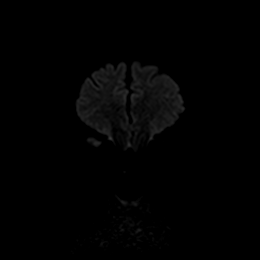
[im 32/64]
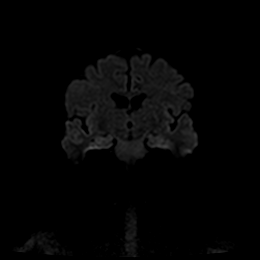
[im 48/64]
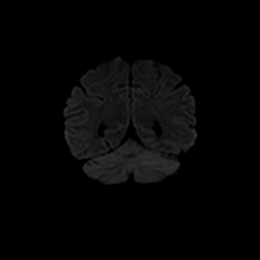
[im 64/64]
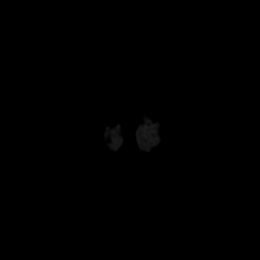

[Series 23: DWI · coronal · 4.0mm · 0.88mm/px · 3 of 32 slices shown (4 of 4)]
[im 1/32]
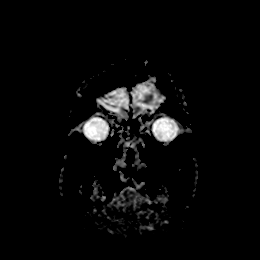
[im 16/32]
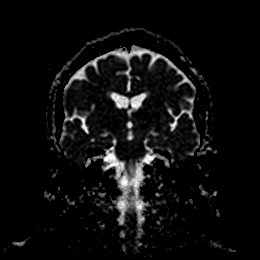
[im 32/32]
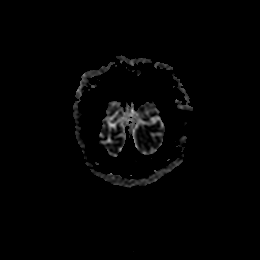

[44 of 48 positions shown; findings below may reference images not displayed]

FINDINGS: MRI HEAD FINDINGS

BRAIN: Faint area of abnormal diffusion restriction at the left
caudate tail. Multifocal white matter hyperintensity, most commonly
due to chronic ischemic microangiopathy. Midline structures are
normal. The CSF spaces are normal for age, with no hydrocephalus.
Blood-sensitive sequences show no chronic microhemorrhage or
superficial siderosis.

SKULL AND UPPER CERVICAL SPINE: The visualized skull base,
calvarium, upper cervical spine and extracranial soft tissues are
normal.

SINUSES/ORBITS: No fluid levels or advanced mucosal thickening. No
mastoid or middle ear effusion. The orbits are normal.

MRA HEAD FINDINGS

POSTERIOR CIRCULATION:

--Basilar artery: Normal.

--Posterior cerebral arteries: Normal. Both originate from the
basilar artery.

--Superior cerebellar arteries: Normal.

--Inferior cerebellar arteries: Normal anterior and posterior
inferior cerebellar arteries.

ANTERIOR CIRCULATION:

--Intracranial internal carotid arteries: Normal.

--Anterior cerebral arteries: Normal. Both A1 segments are present.
Patent anterior communicating artery.

--Middle cerebral arteries: Normal.

--Posterior communicating arteries: Diminutive or absent
bilaterally.

MRA NECK FINDINGS

Aortic arch: Normal 3 vessel aortic branching pattern. The
visualized subclavian arteries are normal.

Right carotid system: Normal course and caliber without stenosis or
evidence of dissection.

Left carotid system: Normal course and caliber without stenosis or
evidence of dissection.

Vertebral arteries: Left dominant. Vertebral artery origins are
normal. Vertebral arteries are normal in course and caliber to the
vertebrobasilar confluence without stenosis or evidence of
dissection.
IMPRESSION: 1. Small focus of acute/subacute ischemia at the left caudate tail.
No hemorrhage or mass effect.
2. Normal intracranial and cervical MRA.

## 2020-04-07 IMAGING — MR MR MRA HEAD W/O CM
1 series · 18 of 48 positions shown · IV contrast (gadavist)
Comparison: None.

CLINICAL DATA: Right-sided weakness with slurred speech

EXAM:
MR HEAD WITHOUT CONTRAST
MR CIRCLE OF WILLIS WITHOUT CONTRAST
MRA OF THE NECK WITHOUT AND WITH CONTRAST
TECHNIQUE: Multiplanar, multiecho pulse sequences of the brain, circle of
willis and surrounding structures were obtained without intravenous
contrast. Angiographic images of the neck were obtained using MRA
technique without and with intravenous contrast.
CONTRAST:  7.5mL GADAVIST GADOBUTROL 1 MMOL/ML IV SOLN

[Series 1: 3d cow · axial · 0.5mm · 0.41mm/px · z∈[-110,-29]mm · 18 of 172 slices shown]
[im 1/172]
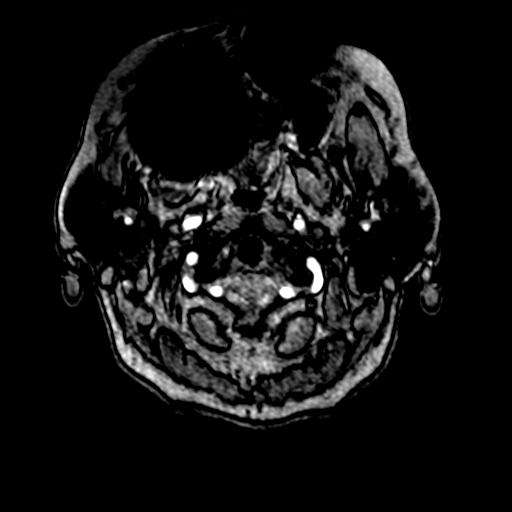
[im 4/172]
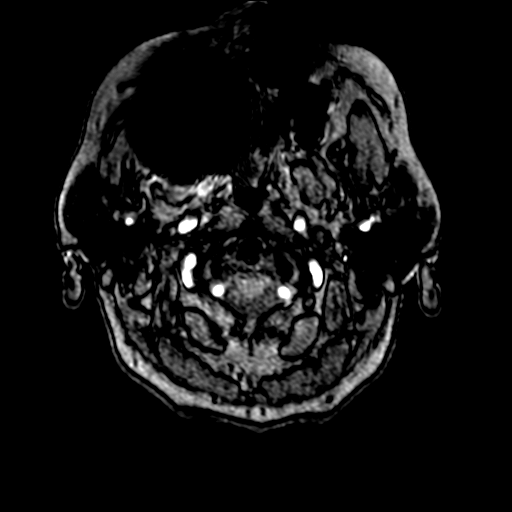
[im 8/172]
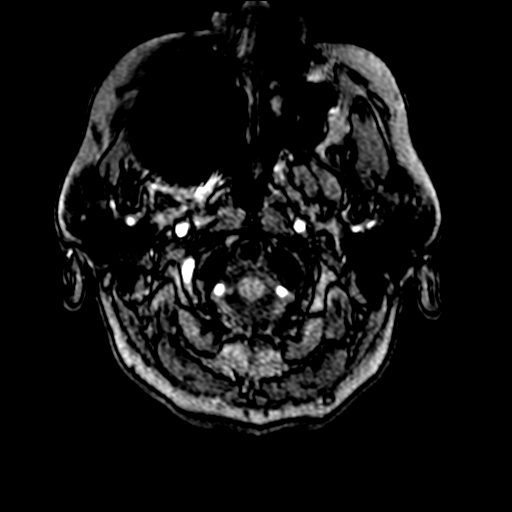
[im 11/172]
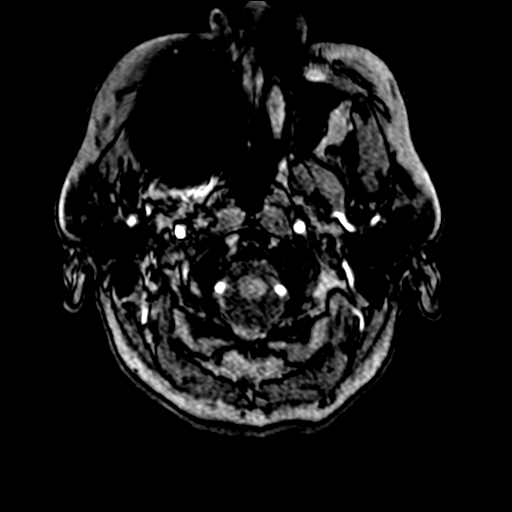
[im 15/172]
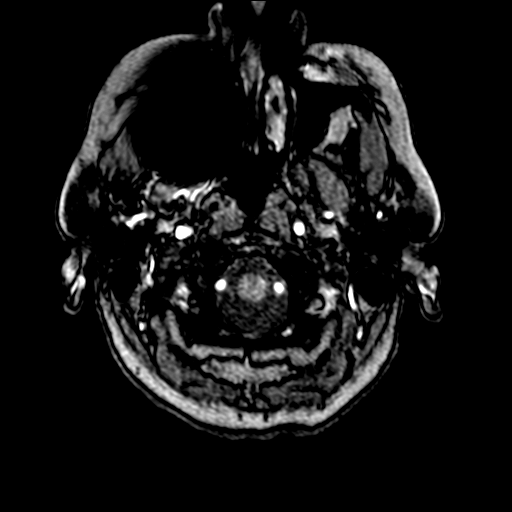
[im 19/172]
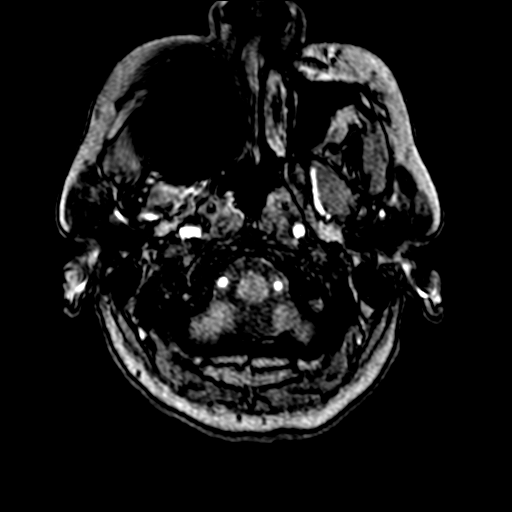
[im 22/172]
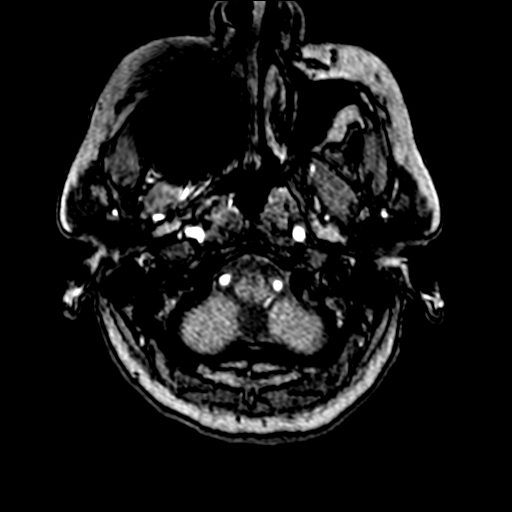
[im 26/172]
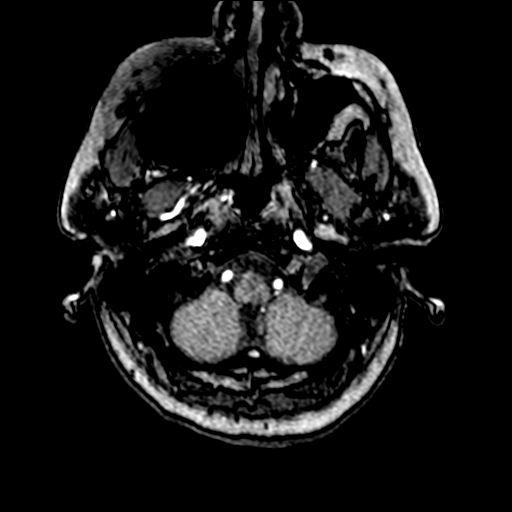
[im 30/172]
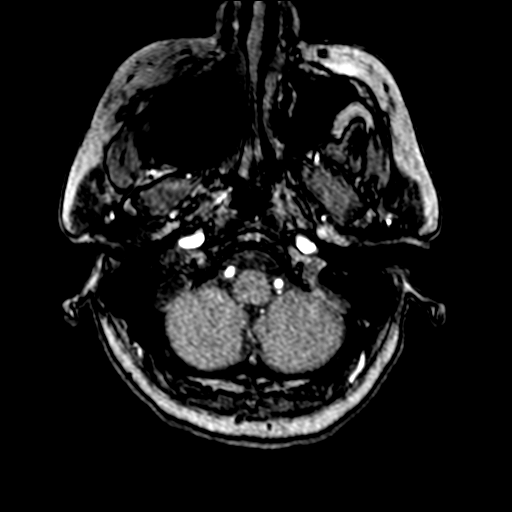
[im 33/172]
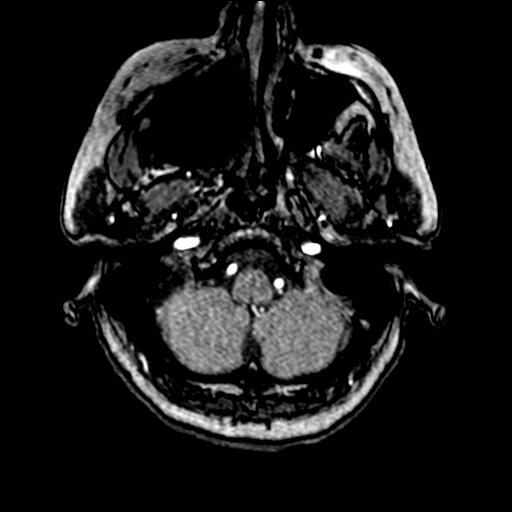
[im 55/172]
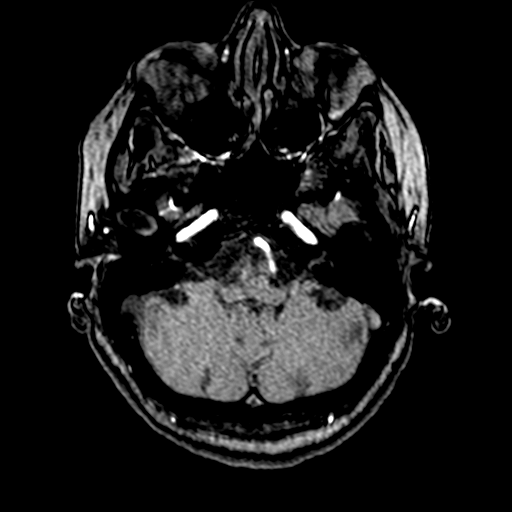
[im 77/172]
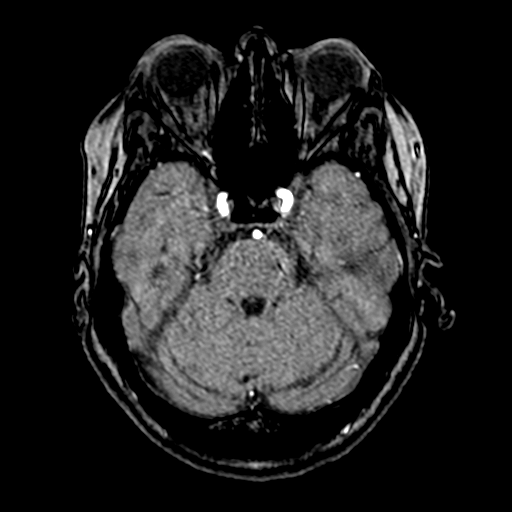
[im 88/172]
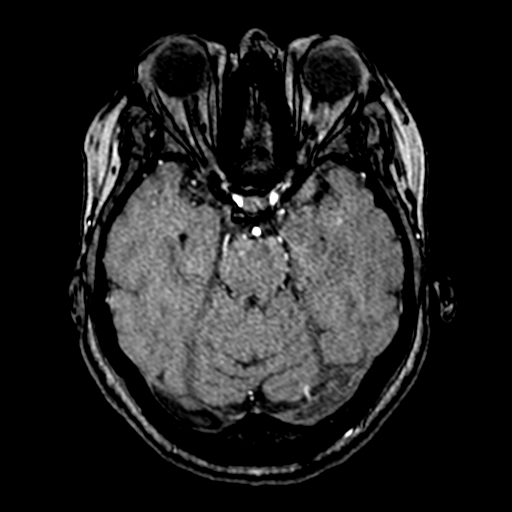
[im 99/172]
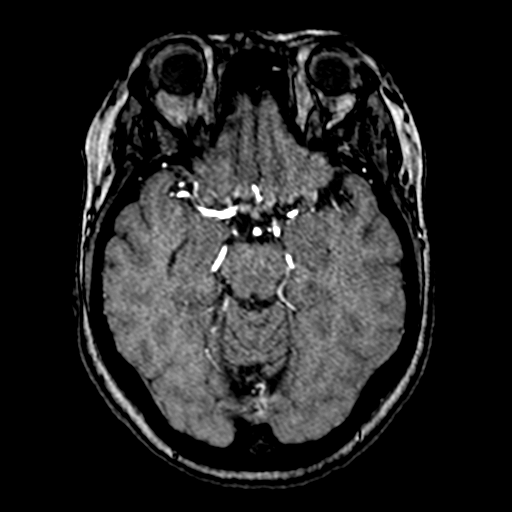
[im 121/172]
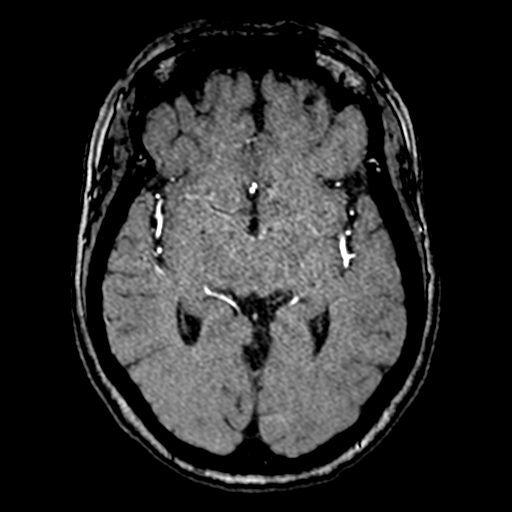
[im 142/172]
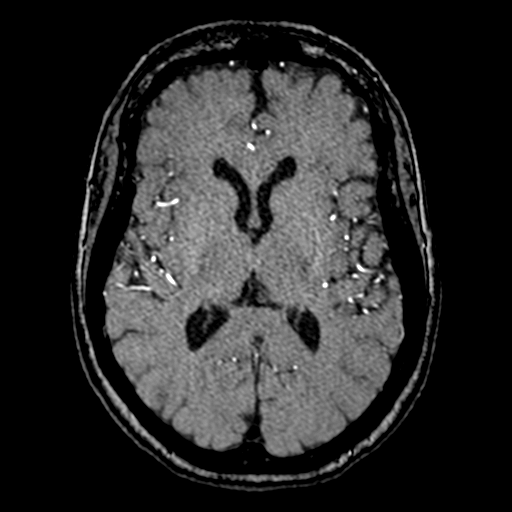
[im 146/172]
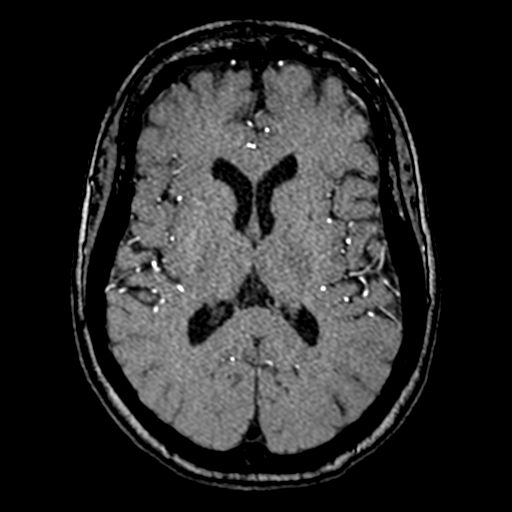
[im 164/172]
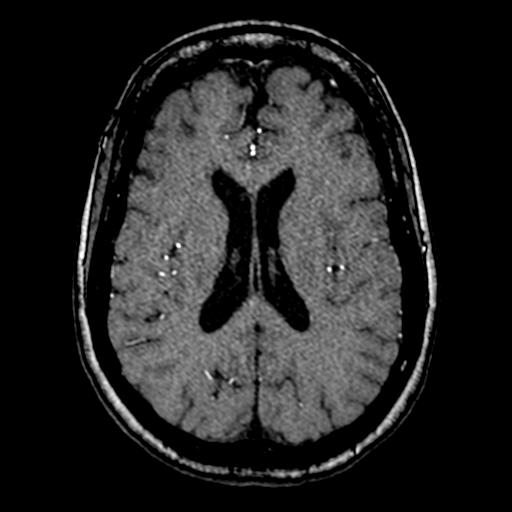

[18 of 48 positions shown; findings below may reference images not displayed]

FINDINGS: MRI HEAD FINDINGS

BRAIN: Faint area of abnormal diffusion restriction at the left
caudate tail. Multifocal white matter hyperintensity, most commonly
due to chronic ischemic microangiopathy. Midline structures are
normal. The CSF spaces are normal for age, with no hydrocephalus.
Blood-sensitive sequences show no chronic microhemorrhage or
superficial siderosis.

SKULL AND UPPER CERVICAL SPINE: The visualized skull base,
calvarium, upper cervical spine and extracranial soft tissues are
normal.

SINUSES/ORBITS: No fluid levels or advanced mucosal thickening. No
mastoid or middle ear effusion. The orbits are normal.

MRA HEAD FINDINGS

POSTERIOR CIRCULATION:

--Basilar artery: Normal.

--Posterior cerebral arteries: Normal. Both originate from the
basilar artery.

--Superior cerebellar arteries: Normal.

--Inferior cerebellar arteries: Normal anterior and posterior
inferior cerebellar arteries.

ANTERIOR CIRCULATION:

--Intracranial internal carotid arteries: Normal.

--Anterior cerebral arteries: Normal. Both A1 segments are present.
Patent anterior communicating artery.

--Middle cerebral arteries: Normal.

--Posterior communicating arteries: Diminutive or absent
bilaterally.

MRA NECK FINDINGS

Aortic arch: Normal 3 vessel aortic branching pattern. The
visualized subclavian arteries are normal.

Right carotid system: Normal course and caliber without stenosis or
evidence of dissection.

Left carotid system: Normal course and caliber without stenosis or
evidence of dissection.

Vertebral arteries: Left dominant. Vertebral artery origins are
normal. Vertebral arteries are normal in course and caliber to the
vertebrobasilar confluence without stenosis or evidence of
dissection.
IMPRESSION: 1. Small focus of acute/subacute ischemia at the left caudate tail.
No hemorrhage or mass effect.
2. Normal intracranial and cervical MRA.

## 2020-04-07 MED ORDER — ASPIRIN 325 MG PO TABS
325.0000 mg | ORAL_TABLET | Freq: Every day | ORAL | Status: DC
  Administered 2020-04-07: 325 mg via ORAL
  Filled 2020-04-07: qty 1

## 2020-04-07 MED ORDER — STROKE: EARLY STAGES OF RECOVERY BOOK: Freq: Once | Status: DC

## 2020-04-07 MED ORDER — ACETAMINOPHEN 325 MG PO TABS: 650.0000 mg | ORAL_TABLET | ORAL | Status: DC | PRN

## 2020-04-07 MED ORDER — FOLIC ACID 1 MG PO TABS
1.0000 mg | ORAL_TABLET | Freq: Every day | ORAL | Status: DC
  Administered 2020-04-07 – 2020-04-08 (×2): 1 mg via ORAL
  Filled 2020-04-07 (×2): qty 1

## 2020-04-07 MED ORDER — LORAZEPAM 2 MG/ML IJ SOLN
1.0000 mg | Freq: Once | INTRAMUSCULAR | Status: AC
  Administered 2020-04-07: 1 mg via INTRAVENOUS
  Filled 2020-04-07: qty 1

## 2020-04-07 MED ORDER — ACETAMINOPHEN 650 MG RE SUPP: 650.0000 mg | RECTAL | Status: DC | PRN

## 2020-04-07 MED ORDER — GADOBUTROL 1 MMOL/ML IV SOLN
7.5000 mL | Freq: Once | INTRAVENOUS | Status: AC | PRN
  Administered 2020-04-07: 7.5 mL via INTRAVENOUS

## 2020-04-07 MED ORDER — THIAMINE HCL 100 MG PO TABS
100.0000 mg | ORAL_TABLET | Freq: Every day | ORAL | Status: DC
  Administered 2020-04-07 – 2020-04-08 (×2): 100 mg via ORAL
  Filled 2020-04-07 (×2): qty 1

## 2020-04-07 MED ORDER — ENOXAPARIN SODIUM 40 MG/0.4ML ~~LOC~~ SOLN
40.0000 mg | SUBCUTANEOUS | Status: DC
  Administered 2020-04-07 – 2020-04-08 (×2): 40 mg via SUBCUTANEOUS
  Filled 2020-04-07 (×2): qty 0.4

## 2020-04-07 MED ORDER — LABETALOL HCL 5 MG/ML IV SOLN: 5.0000 mg | INTRAVENOUS | Status: DC | PRN

## 2020-04-07 MED ORDER — PERFLUTREN LIPID MICROSPHERE
1.0000 mL | INTRAVENOUS | Status: AC | PRN
  Administered 2020-04-07: 4 mL via INTRAVENOUS
  Filled 2020-04-07: qty 10

## 2020-04-07 MED ORDER — LORAZEPAM 1 MG PO TABS: 1.0000 mg | ORAL_TABLET | ORAL | Status: DC | PRN

## 2020-04-07 MED ORDER — SODIUM CHLORIDE 0.9 % IV SOLN: Freq: Once | INTRAVENOUS | Status: AC

## 2020-04-07 MED ORDER — LORAZEPAM 2 MG/ML IJ SOLN: 1.0000 mg | INTRAMUSCULAR | Status: DC | PRN

## 2020-04-07 MED ORDER — THIAMINE HCL 100 MG/ML IJ SOLN: 100.0000 mg | Freq: Every day | INTRAMUSCULAR | Status: DC

## 2020-04-07 MED ORDER — ASPIRIN 300 MG RE SUPP: 300.0000 mg | Freq: Every day | RECTAL | Status: DC

## 2020-04-07 MED ORDER — ADULT MULTIVITAMIN W/MINERALS CH
1.0000 | ORAL_TABLET | Freq: Every day | ORAL | Status: DC
  Administered 2020-04-07 – 2020-04-08 (×2): 1 via ORAL
  Filled 2020-04-07 (×2): qty 1

## 2020-04-07 MED ORDER — LABETALOL HCL 5 MG/ML IV SOLN
20.0000 mg | Freq: Once | INTRAVENOUS | Status: AC
  Administered 2020-04-07: 20 mg via INTRAVENOUS
  Filled 2020-04-07: qty 4

## 2020-04-07 MED ORDER — SENNOSIDES-DOCUSATE SODIUM 8.6-50 MG PO TABS: 1.0000 | ORAL_TABLET | Freq: Every evening | ORAL | Status: DC | PRN

## 2020-04-07 MED ORDER — ACETAMINOPHEN 160 MG/5ML PO SOLN: 650.0000 mg | ORAL | Status: DC | PRN

## 2020-04-07 NOTE — ED Notes (Signed)
Called to activate code stroke @ 1:15 am

## 2020-04-07 NOTE — ED Provider Notes (Signed)
[facility] EMERGENCY DEPARTMENT Provider Note   CSN: [id number] Arrival date & time: 04/07/20  0047   History Chief Complaint  Patient presents with  . Slurred Speech  . Code Stroke    Andrea George is a 69 y.o. female.  The history is provided by the patient and a relative.  She has history of hypertension, hyperlipidemia and is brought in because of slurred speech and inability to stand.  Daughter states that she last spoke with her at 10:30 PM.  She had gone out and, when she came back, noted that her speech was slurred and stuttering and nonfluent.  Speech seems to wax and wane.  She was unable to stand and she seemed tremulous, especially on her right side.  Patient did complain of a mild headache which is no longer present.  There has been no chest pain and no nausea or vomiting.  She has never had any similar symptoms before.  Past Medical History:  Diagnosis Date  . Heart murmur   . Hypercholesterolemia   . Hypertension   . Tachycardia     There are no problems to display for this patient.   Past Surgical History:  Procedure Laterality Date  . CARPAL TUNNEL RELEASE    . SHOULDER SURGERY       OB History   No obstetric history on file.     No family history on file.  Social History   Tobacco Use  . Smoking status: Never Smoker  . Smokeless tobacco: Never Used  Substance Use Topics  . Alcohol use: Yes  . Drug use: Never    Home Medications Prior to Admission medications   Not on File    Allergies    Statins  Review of Systems   Review of Systems  All other systems reviewed and are negative.   Physical Exam Updated Vital Signs BP (!) 240/97 (BP Location: Right Arm)   Pulse 81   Temp 98 F (36.7 C) (Oral)   Resp 18   Ht 5\' 1"  (1.549 m)   Wt 75 kg   SpO2 99%   BMI 31.24 kg/m   Physical Exam Vitals and nursing note reviewed.   69 year old female, resting comfortably and in no acute distress. Vital signs are  significant for markedly elevated blood pressure. Oxygen saturation is 99%, which is normal. Head is normocephalic and atraumatic. PERRLA, EOMI. Oropharynx is clear. Neck is nontender and supple without adenopathy or JVD. Back is nontender and there is no CVA tenderness. Lungs are clear without rales, wheezes, or rhonchi. Chest is nontender. Heart has regular rate and rhythm without murmur. Abdomen is soft, flat, nontender without masses or hepatosplenomegaly and peristalsis is normoactive. Extremities have no cyanosis or edema, full range of motion is present. Skin is warm and dry without rash. Neurologic: Awake and alert and oriented x3, speech is normal to my evaluation but daughter states that it is less fluent than her baseline, cranial nerves are intact.  There is mild tremulousness diffusely.  There is no pronator drift, but there is slight weakness of the right arm and right leg compared with the left.  Patient is right-handed.  ED Results / Procedures / Treatments   Labs (all labs ordered are listed, but only abnormal results are displayed) Labs Reviewed  CBC - Abnormal; Notable for the following components:      Result Value   RDW 11.3 (*)    All other components within normal limits  COMPREHENSIVE  METABOLIC PANEL - Abnormal; Notable for the following components:   Glucose, Bld 102 (*)    AST 46 (*)    All other components within normal limits  RESPIRATORY PANEL BY RT PCR (FLU A&B, COVID)  ETHANOL  PROTIME-INR  APTT  DIFFERENTIAL  RAPID URINE DRUG SCREEN, HOSP PERFORMED  URINALYSIS, ROUTINE W REFLEX MICROSCOPIC  CBG MONITORING, ED  I-STAT CHEM 8, ED    EKG EKG Interpretation  Date/Time:  Saturday Apr 07 2020 00:57:54 EDT Ventricular Rate:  72 PR Interval:  150 QRS Duration: 74 QT Interval:  396 QTC Calculation: 433 R Axis:   46 Text Interpretation: Normal sinus rhythm ST & T wave abnormality, consider inferior ischemia Abnormal ECG No old tracing to compare  Confirmed by Dione Booze ([id number]) on 04/07/2020 1:18:45 AM   Radiology MR ANGIO HEAD WO CONTRAST  Result Date: 04/07/2020 CLINICAL DATA:  Right-sided weakness with slurred speech EXAM: MR HEAD WITHOUT CONTRAST MR CIRCLE OF WILLIS WITHOUT CONTRAST MRA OF THE NECK WITHOUT AND WITH CONTRAST TECHNIQUE: Multiplanar, multiecho pulse sequences of the brain, circle of willis and surrounding structures were obtained without intravenous contrast. Angiographic images of the neck were obtained using MRA technique without and with intravenous contrast. CONTRAST:  7.5mL GADAVIST GADOBUTROL 1 MMOL/ML IV SOLN COMPARISON:  None. FINDINGS: MRI HEAD FINDINGS BRAIN: Faint area of abnormal diffusion restriction at the left caudate tail. 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 Midline structures are normal. The CSF spaces are normal for age, with no hydrocephalus. Blood-sensitive sequences show no chronic microhemorrhage or superficial siderosis. SKULL AND UPPER CERVICAL SPINE: The visualized skull base, calvarium, upper cervical spine and extracranial soft tissues are normal. SINUSES/ORBITS: No fluid levels or advanced mucosal thickening. No mastoid or middle ear effusion. The orbits are normal. MRA HEAD FINDINGS POSTERIOR CIRCULATION: --Basilar artery: Normal. --Posterior cerebral arteries: Normal. Both originate from the basilar artery. --Superior cerebellar arteries: Normal. --Inferior cerebellar arteries: Normal anterior and posterior inferior cerebellar arteries. ANTERIOR CIRCULATION: --Intracranial internal carotid arteries: Normal. --Anterior cerebral arteries: Normal. Both A1 segments are present. Patent anterior communicating artery. --Middle cerebral arteries: Normal. --Posterior communicating arteries: Diminutive or absent bilaterally. MRA NECK FINDINGS Aortic arch: Normal 3 vessel aortic branching pattern. The visualized subclavian arteries are normal. Right carotid  system: Normal course and caliber without stenosis or evidence of dissection. Left carotid system: Normal course and caliber without stenosis or evidence of dissection. Vertebral arteries: Left dominant. Vertebral artery origins are normal. Vertebral arteries are normal in course and caliber to the vertebrobasilar confluence without stenosis or evidence of dissection. IMPRESSION: 1. Small focus of acute/subacute ischemia at the left caudate tail. No hemorrhage or mass effect. 2. Normal intracranial and cervical MRA. Electronically Signed   By: Deatra Robinson M.D.   On: 04/07/2020 04:27   MR ANGIO NECK W WO CONTRAST  Result Date: 04/07/2020 CLINICAL DATA:  Right-sided weakness with slurred speech EXAM: MR HEAD WITHOUT CONTRAST MR CIRCLE OF WILLIS WITHOUT CONTRAST MRA OF THE NECK WITHOUT AND WITH CONTRAST TECHNIQUE: Multiplanar, multiecho pulse sequences of the brain, circle of willis and surrounding structures were obtained without intravenous contrast. Angiographic images of the neck were obtained using MRA technique without and with intravenous contrast. CONTRAST:  7.20mL GADAVIST GADOBUTROL 1 MMOL/ML IV SOLN COMPARISON:  None. FINDINGS: MRI HEAD FINDINGS BRAIN: Faint area of abnormal diffusion restriction at the left caudate tail. 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 Midline structures are normal. The CSF spaces are  normal for age, with no hydrocephalus. Blood-sensitive sequences show no chronic microhemorrhage or superficial siderosis. SKULL AND UPPER CERVICAL SPINE: The visualized skull base, calvarium, upper cervical spine and extracranial soft tissues are normal. SINUSES/ORBITS: No fluid levels or advanced mucosal thickening. No mastoid or middle ear effusion. The orbits are normal. MRA HEAD FINDINGS POSTERIOR CIRCULATION: --Basilar artery: Normal. --Posterior cerebral arteries: Normal. Both originate from the basilar artery. --Superior cerebellar  arteries: Normal. --Inferior cerebellar arteries: Normal anterior and posterior inferior cerebellar arteries. ANTERIOR CIRCULATION: --Intracranial internal carotid arteries: Normal. --Anterior cerebral arteries: Normal. Both A1 segments are present. Patent anterior communicating artery. --Middle cerebral arteries: Normal. --Posterior communicating arteries: Diminutive or absent bilaterally. MRA NECK FINDINGS Aortic arch: Normal 3 vessel aortic branching pattern. The visualized subclavian arteries are normal. Right carotid system: Normal course and caliber without stenosis or evidence of dissection. Left carotid system: Normal course and caliber without stenosis or evidence of dissection. Vertebral arteries: Left dominant. Vertebral artery origins are normal. Vertebral arteries are normal in course and caliber to the vertebrobasilar confluence without stenosis or evidence of dissection. IMPRESSION: 1. Small focus of acute/subacute ischemia at the left caudate tail. No hemorrhage or mass effect. 2. Normal intracranial and cervical MRA. Electronically Signed   By: Ulyses Jarred M.D.   On: 04/07/2020 04:27   MR BRAIN WO CONTRAST  Result Date: 04/07/2020 CLINICAL DATA:  Right-sided weakness with slurred speech EXAM: MR HEAD WITHOUT CONTRAST MR CIRCLE OF WILLIS WITHOUT CONTRAST MRA OF THE NECK WITHOUT AND WITH CONTRAST TECHNIQUE: Multiplanar, multiecho pulse sequences of the brain, circle of willis and surrounding structures were obtained without intravenous contrast. Angiographic images of the neck were obtained using MRA technique without and with intravenous contrast. CONTRAST:  7.34mL GADAVIST GADOBUTROL 1 MMOL/ML IV SOLN COMPARISON:  None. FINDINGS: MRI HEAD FINDINGS BRAIN: Faint area of abnormal diffusion restriction at the left caudate tail. 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 Midline structures are normal. The CSF spaces are normal for age, with no  hydrocephalus. Blood-sensitive sequences show no chronic microhemorrhage or superficial siderosis. SKULL AND UPPER CERVICAL SPINE: The visualized skull base, calvarium, upper cervical spine and extracranial soft tissues are normal. SINUSES/ORBITS: No fluid levels or advanced mucosal thickening. No mastoid or middle ear effusion. The orbits are normal. MRA HEAD FINDINGS POSTERIOR CIRCULATION: --Basilar artery: Normal. --Posterior cerebral arteries: Normal. Both originate from the basilar artery. --Superior cerebellar arteries: Normal. --Inferior cerebellar arteries: Normal anterior and posterior inferior cerebellar arteries. ANTERIOR CIRCULATION: --Intracranial internal carotid arteries: Normal. --Anterior cerebral arteries: Normal. Both A1 segments are present. Patent anterior communicating artery. --Middle cerebral arteries: Normal. --Posterior communicating arteries: Diminutive or absent bilaterally. MRA NECK FINDINGS Aortic arch: Normal 3 vessel aortic branching pattern. The visualized subclavian arteries are normal. Right carotid system: Normal course and caliber without stenosis or evidence of dissection. Left carotid system: Normal course and caliber without stenosis or evidence of dissection. Vertebral arteries: Left dominant. Vertebral artery origins are normal. Vertebral arteries are normal in course and caliber to the vertebrobasilar confluence without stenosis or evidence of dissection. IMPRESSION: 1. Small focus of acute/subacute ischemia at the left caudate tail. No hemorrhage or mass effect. 2. Normal intracranial and cervical MRA. Electronically Signed   By: Ulyses Jarred M.D.   On: 04/07/2020 04:27   CT HEAD CODE STROKE WO CONTRAST  Result Date: 04/07/2020 CLINICAL DATA:  Code stroke. Right-sided weakness and slurred speech EXAM: CT HEAD WITHOUT CONTRAST TECHNIQUE: Contiguous axial images were obtained from the base  of the skull through the vertex without intravenous contrast. COMPARISON:  None.  FINDINGS: Brain: There is no mass, hemorrhage or extra-axial collection. The size and configuration of the ventricles and extra-axial CSF spaces are normal. The brain parenchyma is normal, without evidence of acute or chronic infarction. Vascular: No abnormal hyperdensity of the major intracranial arteries or dural venous sinuses. No intracranial atherosclerosis. Skull: The visualized skull base, calvarium and extracranial soft tissues are normal. Sinuses/Orbits: No fluid levels or advanced mucosal thickening of the visualized paranasal sinuses. No mastoid or middle ear effusion. The orbits are normal. ASPECTS [facility] Stroke Program Early CT Score) - Ganglionic level infarction (caudate, lentiform nuclei, internal capsule, insula, M1-M3 cortex): 7 - Supraganglionic infarction (M4-M6 cortex): 3 Total score (0-10 with 10 being normal): 10 IMPRESSION: 1. Normal head CT. 2. ASPECTS is 10. * These results were communicated to Dr. Ritta Slot at 1:36 am on 04/07/2020 by text page via the [facility] messaging system. Electronically Signed   By: Deatra Robinson M.D.   On: 04/07/2020 01:36    Procedures Procedures  CRITICAL CARE Performed by: Dione Booze Total critical care time: 50 minutes Critical care time was exclusive of separately billable procedures and treating other patients. Critical care was necessary to treat or prevent imminent or life-threatening deterioration. Critical care was time spent personally by me on the following activities: development of treatment plan with patient and/or surrogate as well as nursing, discussions with consultants, evaluation of patient's response to treatment, examination of patient, obtaining history from patient or surrogate, ordering and performing treatments and interventions, ordering and review of laboratory studies, ordering and review of radiographic studies, pulse oximetry and re-evaluation of patient's condition.  Medications Ordered in ED Medications - No  data to display  ED Course  I have reviewed the triage vital signs and the nursing notes.  Pertinent labs & imaging results that were available during my care of the patient were reviewed by me and considered in my medical decision making (see chart for details).  MDM Rules/Calculators/A&P Stroke.  Uncontrolled hypertension.  Code stroke is activated and she is sent for emergent CT of head.  There are no old records in the [facility] health system.   1:43 AM CT head is unremarkable.  Blood pressure continues to be markedly elevated.  She is given a dose of labetalol for blood pressure control.  She is being sent for MRI to look for evidence of stroke.  5:39 AM MRI does show a stroke in the left caudate nucleus.  Labs are significant only for minimal elevation of AST of doubtful clinical significance.  Blood pressure has come down with IV labetalol and is now in the normal range.  Case is discussed with Dr. Julian Reil of [facility], who agrees to admit the patient.  Final Clinical Impression(s) / ED Diagnoses Final diagnoses:  Cerebrovascular accident (CVA), unspecified mechanism (HCC)  Elevated AST (SGOT)  Elevated blood pressure reading with diagnosis of hypertension    Rx / DC Orders ED Discharge Orders    None       Dione Booze, MD 04/07/20 [phone]

## 2020-04-07 NOTE — Progress Notes (Signed)
  Echocardiogram 2D Echocardiogram has been performed with Definity.  Gerda Diss 04/07/2020, 2:36 PM

## 2020-04-07 NOTE — ED Notes (Signed)
purewick placed on patient.  

## 2020-04-07 NOTE — Progress Notes (Signed)
NEURO HOSPITALIST PROGRESS NOTE   Subjective: Patient awake, alert, NAD. Son at bedside. Still mildly dysarthric. Mri results reviewed with patient and son. Questions answered.   Exam: Vitals:   04/07/20 0956 04/07/20 1048  BP:  (!) 158/57  Pulse:  77  Resp:  20  Temp: 98.6 F (37 C) 98.3 F (36.8 C)  SpO2:  99%    Physical Exam  Constitutional: Appears well-developed and well-nourished.  Psych: Affect appropriate to situation Eyes: Normal external eye and conjunctiva. HENT: Normocephalic, no lesions, without obvious abnormality.   Musculoskeletal-no joint tenderness, deformity or swelling Cardiovascular: Normal rate and regular rhythm.  Respiratory: Effort normal, non-labored breathing saturations WNL on RA GI: Soft.  No distension. There is no tenderness.  Skin: WDI   Neuro:  Mental Status: Alert, oriented, thought content appropriate.  Mildly dysarthric. Speech fluent without evidence of aphasia.  Able to follow  commands without difficulty. Cranial Nerves: II:  Visual fields grossly normal,  III,IV, VI: ptosis not present, extra-ocular motions intact bilaterally pupils equal, round, reactive to light and accommodation V,VII: smile symmetric, facial light touch sensation normal bilaterally VIII: hearing normal bilaterally IX,X: uvula rises symmetrically XI: bilateral shoulder shrug XII: midline tongue extension Motor: Right : Upper extremity   5/5 Left:     Upper extremity   5/5  Lower extremity   4+/5  Lower extremity   5/5 Tone and bulk:normal tone throughout; no atrophy noted Sensory: decreased to light touch on right arm, face, and leg bilaterally Deep Tendon Reflexes: 2+ and symmetric biceps, patella Plantars: Right: downgoing   Left: downgoing Cerebellar: No ataxia Gait: normal gait and station    Medications:  Scheduled: .  stroke: mapping our early stages of recovery book   Does not apply Once  . aspirin  300 mg Rectal Daily    Or  . aspirin  325 mg Oral Daily  . enoxaparin (LOVENOX) injection  40 mg Subcutaneous Q24H  . folic acid  1 mg Oral Daily  . multivitamin with minerals  1 tablet Oral Daily  . thiamine  100 mg Oral Daily   Or  . thiamine  100 mg Intravenous Daily   Continuous:  WLS:[id number] **OR** acetaminophen (TYLENOL) oral liquid 160 mg/5 mL **OR** acetaminophen, labetalol, LORazepam **OR** LORazepam, perflutren lipid microspheres (DEFINITY) IV suspension, senna-docusate  Pertinent Labs/Diagnostics:   DG Chest 2 View  Result Date: 04/07/2020 CLINICAL DATA:  Slurred speech lightheaded EXAM: CHEST - 2 VIEW COMPARISON:  None. FINDINGS: The heart size and mediastinal contours are within normal limits. Both lungs are clear. Likely chronic mild interstitial prominence. No pleural effusion or pneumothorax. The visualized skeletal structures are unremarkable. IMPRESSION: No acute process in the chest. Electronically Signed   By: Guadlupe Spanish M.D.   On: 04/07/2020 08:09   MR ANGIO HEAD WO CONTRAST  Result Date: 04/07/2020 CLINICAL DATA:  Right-sided weakness with slurred speech EXAM: MR HEAD WITHOUT CONTRAST MR CIRCLE OF WILLIS WITHOUT CONTRAST MRA OF THE NECK WITHOUT AND WITH CONTRAST TECHNIQUE: Multiplanar, multiecho pulse sequences of the brain, circle of willis and surrounding structures were obtained without intravenous contrast. Angiographic images of the neck were obtained using MRA technique without and with intravenous contrast. CONTRAST:  7.45mL GADAVIST GADOBUTROL 1 MMOL/ML IV SOLN COMPARISON:  None. FINDINGS: MRI HEAD FINDINGS BRAIN: Faint area of abnormal diffusion restriction at the left caudate tail. 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 Midline structures are normal. The CSF spaces are normal for age, with no hydrocephalus. Blood-sensitive sequences show no chronic microhemorrhage or superficial siderosis. SKULL AND UPPER CERVICAL SPINE:  The visualized skull base, calvarium, upper cervical spine and extracranial soft tissues are normal. SINUSES/ORBITS: No fluid levels or advanced mucosal thickening. No mastoid or middle ear effusion. The orbits are normal. MRA HEAD FINDINGS POSTERIOR CIRCULATION: --Basilar artery: Normal. --Posterior cerebral arteries: Normal. Both originate from the basilar artery. --Superior cerebellar arteries: Normal. --Inferior cerebellar arteries: Normal anterior and posterior inferior cerebellar arteries. ANTERIOR CIRCULATION: --Intracranial internal carotid arteries: Normal. --Anterior cerebral arteries: Normal. Both A1 segments are present. Patent anterior communicating artery. --Middle cerebral arteries: Normal. --Posterior communicating arteries: Diminutive or absent bilaterally. MRA NECK FINDINGS Aortic arch: Normal 3 vessel aortic branching pattern. The visualized subclavian arteries are normal. Right carotid system: Normal course and caliber without stenosis or evidence of dissection. Left carotid system: Normal course and caliber without stenosis or evidence of dissection. Vertebral arteries: Left dominant. Vertebral artery origins are normal. Vertebral arteries are normal in course and caliber to the vertebrobasilar confluence without stenosis or evidence of dissection. IMPRESSION: 1. Small focus of acute/subacute ischemia at the left caudate tail. No hemorrhage or mass effect. 2. Normal intracranial and cervical MRA. Electronically Signed   By: Deatra Robinson M.D.   On: 04/07/2020 04:27   MR ANGIO NECK W WO CONTRAST  Result Date: 04/07/2020 CLINICAL DATA:  Right-sided weakness with slurred speech EXAM: MR HEAD WITHOUT CONTRAST MR CIRCLE OF WILLIS WITHOUT CONTRAST MRA OF THE NECK WITHOUT AND WITH CONTRAST TECHNIQUE: Multiplanar, multiecho pulse sequences of the brain, circle of willis and surrounding structures were obtained without intravenous contrast. Angiographic images of the neck were obtained using MRA  technique without and with intravenous contrast. CONTRAST:  7.80mL GADAVIST GADOBUTROL 1 MMOL/ML IV SOLN COMPARISON:  None. FINDINGS: MRI HEAD FINDINGS BRAIN: Faint area of abnormal diffusion restriction at the left caudate tail. 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 Midline structures are normal. The CSF spaces are normal for age, with no hydrocephalus. Blood-sensitive sequences show no chronic microhemorrhage or superficial siderosis. SKULL AND UPPER CERVICAL SPINE: The visualized skull base, calvarium, upper cervical spine and extracranial soft tissues are normal. SINUSES/ORBITS: No fluid levels or advanced mucosal thickening. No mastoid or middle ear effusion. The orbits are normal. MRA HEAD FINDINGS POSTERIOR CIRCULATION: --Basilar artery: Normal. --Posterior cerebral arteries: Normal. Both originate from the basilar artery. --Superior cerebellar arteries: Normal. --Inferior cerebellar arteries: Normal anterior and posterior inferior cerebellar arteries. ANTERIOR CIRCULATION: --Intracranial internal carotid arteries: Normal. --Anterior cerebral arteries: Normal. Both A1 segments are present. Patent anterior communicating artery. --Middle cerebral arteries: Normal. --Posterior communicating arteries: Diminutive or absent bilaterally. MRA NECK FINDINGS Aortic arch: Normal 3 vessel aortic branching pattern. The visualized subclavian arteries are normal. Right carotid system: Normal course and caliber without stenosis or evidence of dissection. Left carotid system: Normal course and caliber without stenosis or evidence of dissection. Vertebral arteries: Left dominant. Vertebral artery origins are normal. Vertebral arteries are normal in course and caliber to the vertebrobasilar confluence without stenosis or evidence of dissection. IMPRESSION: 1. Small focus of acute/subacute ischemia at the left caudate tail. No hemorrhage or mass effect. 2. Normal  intracranial and cervical MRA. Electronically Signed   By: Deatra Robinson M.D.   On: 04/07/2020 04:27   MR BRAIN WO CONTRAST  Result Date: 04/07/2020 CLINICAL DATA:  Right-sided weakness with slurred speech  EXAM: MR HEAD WITHOUT CONTRAST MR CIRCLE OF WILLIS WITHOUT CONTRAST MRA OF THE NECK WITHOUT AND WITH CONTRAST TECHNIQUE: Multiplanar, multiecho pulse sequences of the brain, circle of willis and surrounding structures were obtained without intravenous contrast. Angiographic images of the neck were obtained using MRA technique without and with intravenous contrast. CONTRAST:  7.73mL GADAVIST GADOBUTROL 1 MMOL/ML IV SOLN COMPARISON:  None. FINDINGS: MRI HEAD FINDINGS BRAIN: Faint area of abnormal diffusion restriction at the left caudate tail. 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 Midline structures are normal. The CSF spaces are normal for age, with no hydrocephalus. Blood-sensitive sequences show no chronic microhemorrhage or superficial siderosis. SKULL AND UPPER CERVICAL SPINE: The visualized skull base, calvarium, upper cervical spine and extracranial soft tissues are normal. SINUSES/ORBITS: No fluid levels or advanced mucosal thickening. No mastoid or middle ear effusion. The orbits are normal. MRA HEAD FINDINGS POSTERIOR CIRCULATION: --Basilar artery: Normal. --Posterior cerebral arteries: Normal. Both originate from the basilar artery. --Superior cerebellar arteries: Normal. --Inferior cerebellar arteries: Normal anterior and posterior inferior cerebellar arteries. ANTERIOR CIRCULATION: --Intracranial internal carotid arteries: Normal. --Anterior cerebral arteries: Normal. Both A1 segments are present. Patent anterior communicating artery. --Middle cerebral arteries: Normal. --Posterior communicating arteries: Diminutive or absent bilaterally. MRA NECK FINDINGS Aortic arch: Normal 3 vessel aortic branching pattern. The visualized subclavian arteries  are normal. Right carotid system: Normal course and caliber without stenosis or evidence of dissection. Left carotid system: Normal course and caliber without stenosis or evidence of dissection. Vertebral arteries: Left dominant. Vertebral artery origins are normal. Vertebral arteries are normal in course and caliber to the vertebrobasilar confluence without stenosis or evidence of dissection. IMPRESSION: 1. Small focus of acute/subacute ischemia at the left caudate tail. No hemorrhage or mass effect. 2. Normal intracranial and cervical MRA. Electronically Signed   By: Ulyses Jarred M.D.   On: 04/07/2020 04:27   CT HEAD CODE STROKE WO CONTRAST  Result Date: 04/07/2020 CLINICAL DATA:  Code stroke. Right-sided weakness and slurred speech EXAM: CT HEAD WITHOUT CONTRAST TECHNIQUE: Contiguous axial images were obtained from the base of the skull through the vertex without intravenous contrast. COMPARISON:  None. FINDINGS: Brain: There is no mass, hemorrhage or extra-axial collection. The size and configuration of the ventricles and extra-axial CSF spaces are normal. The brain parenchyma is normal, without evidence of acute or chronic infarction. Vascular: No abnormal hyperdensity of the major intracranial arteries or dural venous sinuses. No intracranial atherosclerosis. Skull: The visualized skull base, calvarium and extracranial soft tissues are normal. Sinuses/Orbits: No fluid levels or advanced mucosal thickening of the visualized paranasal sinuses. No mastoid or middle ear effusion. The orbits are normal. ASPECTS [facility] Stroke Program Early CT Score) - Ganglionic level infarction (caudate, lentiform nuclei, internal capsule, insula, M1-M3 cortex): 7 - Supraganglionic infarction (M4-M6 cortex): 3 Total score (0-10 with 10 being normal): 10 IMPRESSION: 1. Normal head CT. 2. ASPECTS is 10. * These results were communicated to Dr. Roland Rack at 1:36 am on 04/07/2020 by text page via the [facility] messaging  system. Electronically Signed   By: Ulyses Jarred M.D.   On: 04/07/2020 01:36   Assessment:  69 year old female with acute onset tremulousness/presyncope/right arm numbness happened in the setting of severe hypertension. TPA was not given d/t mild symptoms. MRA did not show LVO. CTH was negative  For hemorrhage. 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 Complete stroke work up needed.     Recommendations:  -- BP goal :  Permissive HTN upto 220/110 mmHg for another 24 hours then goal is normotensive gradually. --Echocardiogram -- ASA 81mg  and plavix 75 mg daily -- High intensity Statin if LDL > 70 -- HgbA1c, fasting lipid panel -- PT consult, OT consult, Speech consult --Telemetry monitoring --Frequent neuro checks --Stroke swallow screen    , MSN, NP-C [facility] [phone]    --Please page the Stroke team from 8am-4pm.   You can look them up on [email or web address]      04/07/2020, 2:00 PM

## 2020-04-07 NOTE — H&P (Signed)
History and Physical    Andrea George ZOX:[id number] DOB: [birth year] DOA: 04/07/2020  Referring MD/NP/PA: Lyda PeroneJared Gardner, MD PCP: Walker ShadowJungst, Elizabeth, MD  Patient coming from: Home  Chief Complaint: Right-sided weakness and slurred speech  I have personally briefly [facility]   HPI: Andrea Mouldhelma Wolk is a 69 y.o. right hand dominant  female with medical history significant of hypertension, hyperlipidemia, and palpitations presents with complaints of right-sided weakness and slurred speech.  Around 4 PM yesterday patient had complained of a headache and had taken 2 Aleve and laid down.  [facility] but when she returned around 10:30PM had complained of feeling shaky and lightheaded.  Patient intermittently gets lightheaded from time to time, but thought that her blood sugar was possibly low low.  Her daughter gave her a soda to drink, but noticed that her speech was slurred.  The patient noticed some right-sided numbness/weakness for which she felt unsteady.  Normally patient is able to ambulate and complete all of her ADLs without assistance.  Her daughter convinced her to come into the emergency department for further evaluation.  Denies having any recent fever,, nausea, vomiting, diarrhea, or dysuria.  She also reports that she drinks Corona light, but denies any history of withdrawal symptoms.  ED Course: Upon admission into the emergency department patient presented as a code stroke.  Neurology have been consulted.  CT of the brain was within normal limits.  She was not a TPA candidate.  Vitals significant for heart rate 75-105, respiration 15-25, blood pressure elevated up to 240/97, and all other vital signs within normal limits.  Labs were relatively unremarkable.  Influenza and COVID-19 screen negative.  Patient had received labetalol 20 mg once and Ativan 1 mg IV. 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  Review of Systems  Constitutional: Negative for fever and malaise/fatigue.  HENT: Negative for congestion.   Eyes: Negative for blurred vision and photophobia.  Respiratory: Negative for cough and shortness of breath.   Cardiovascular: Positive for palpitations. Negative for chest pain and PND.  Gastrointestinal: Negative for abdominal pain, diarrhea, nausea and vomiting.  Genitourinary: Negative for dysuria and hematuria.  Musculoskeletal: Positive for joint pain (left Hip pain). Negative for falls.  Skin: Negative for rash.  Neurological: Positive for dizziness, speech change, focal weakness and headaches.  Psychiatric/Behavioral: Negative for memory loss. The patient does not have insomnia.     Past Medical History:  Diagnosis Date  . Heart murmur   . Hypercholesterolemia   . Hypertension   . Tachycardia     Past Surgical History:  Procedure Laterality Date  . CARPAL TUNNEL RELEASE    . SHOULDER SURGERY       reports that she has never smoked. She has never used smokeless tobacco. She reports current alcohol use. She reports that she does not use drugs.  Allergies  Allergen Reactions  . Evolocumab Anaphylaxis  . Atorvastatin Other (See Comments)  . Statins     No family history on file.  Prior to Admission medications   Medication Sig Start Date End Date Taking? Authorizing Provider  aspirin 81 MG EC tablet Take 81 mg by mouth daily. 01/19/12  Yes [provider]  metoprolol tartrate (LOPRESSOR) 50 MG tablet Take 50 mg by mouth 2 (two) times daily. 01/26/20  Yes [provider]  Physical Exam:  Constitutional: Elderly female no acute distress Vitals:   04/07/20 0530 04/07/20 0545 04/07/20 0600 04/07/20 0615  BP: (!) 138/51 136/82 124/71 (!) 129/58  Pulse: 85 83 89 81  Resp: 16 18 17 17   Temp:      TempSrc:      SpO2: 94% 95% 96% 94%  Weight:      Height:       Eyes:  PERRL, lids and conjunctivae normal ENMT: Mucous membranes are moist. Posterior pharynx clear of any exudate or lesions. Neck: normal, supple, no masses, no thyromegaly Respiratory: clear to auscultation bilaterally, no wheezing, no crackles. Normal respiratory effort. No accessory muscle use.  Cardiovascular: Regular rate and rhythm, no murmurs / rubs / gallops. No extremity edema. 2+ pedal pulses. No carotid bruits.  Abdomen: no tenderness, no masses palpated. No hepatosplenomegaly. Bowel sounds positive.  Musculoskeletal: no clubbing / cyanosis. No joint deformity upper and lower extremities. Good ROM, no contractures. Normal muscle tone.  Skin: no rashes, lesions, ulcers. No induration Neurologic: Slurred speech.  Right-sided upper and lower extremity 4/5,  left upper and lower extremity 5/5 Psychiatric: Normal judgment and insight. Alert and oriented x 3. Normal mood.     Labs on Admission: I have personally reviewed following labs and imaging studies  CBC: Recent Labs  Lab 04/07/20 0123 04/07/20 0155  WBC 8.2  --   NEUTROABS 4.9  --   HGB 14.0 13.9  HCT 39.9 41.0  MCV 85.1  --   PLT 172  --    Basic Metabolic Panel: Recent Labs  Lab 04/07/20 0123 04/07/20 0155  NA 136 137  K 3.8 3.7  CL 102 104  CO2 22  --   GLUCOSE 102* 95  BUN 12 14  CREATININE 0.88 0.80  CALCIUM 9.8  --    GFR: Estimated Creatinine Clearance: 63.2 mL/min (by C-G formula based on SCr of 0.8 mg/dL). Liver Function Tests: Recent Labs  Lab 04/07/20 0123  AST 46*  ALT 36  ALKPHOS 76  BILITOT 0.7  PROT 8.0  ALBUMIN 4.4   No results for input(s): LIPASE, AMYLASE in the last 168 hours. No results for input(s): AMMONIA in the last 168 hours. Coagulation Profile: Recent Labs  Lab 04/07/20 0123  INR 1.0   Cardiac Enzymes: No results for input(s): CKTOTAL, CKMB, CKMBINDEX, TROPONINI in the last 168 hours. BNP (last 3 results) No results for input(s): PROBNP in the last 8760  hours. HbA1C: No results for input(s): HGBA1C in the last 72 hours. CBG: Recent Labs  Lab 04/07/20 0059  GLUCAP 93   Lipid Profile: No results for input(s): CHOL, HDL, LDLCALC, TRIG, CHOLHDL, LDLDIRECT in the last 72 hours. Thyroid Function Tests: No results for input(s): TSH, T4TOTAL, FREET4, T3FREE, THYROIDAB in the last 72 hours. Anemia Panel: No results for input(s): VITAMINB12, FOLATE, FERRITIN, TIBC, IRON, RETICCTPCT in the last 72 hours. Urine analysis: No results found for: COLORURINE, APPEARANCEUR, LABSPEC, PHURINE, GLUCOSEU, HGBUR, BILIRUBINUR, KETONESUR, PROTEINUR, UROBILINOGEN, NITRITE, LEUKOCYTESUR Sepsis Labs: Recent Results (from the past 240 hour(s))  Respiratory Panel by RT PCR (Flu A&B, Covid) - Nasopharyngeal Swab     Status: None   Collection Time: 04/07/20  1:54 AM   Specimen: Nasopharyngeal Swab  Result Value Ref Range Status   SARS Coronavirus 2 by RT PCR NEGATIVE NEGATIVE Final    Comment: (NOTE) SARS-CoV-2 target nucleic acids are NOT DETECTED. The SARS-CoV-2 RNA is generally detectable in upper respiratoy specimens during the acute phase of infection. The  lowest concentration of SARS-CoV-2 viral copies this assay can detect is 131 copies/mL. A negative result does not preclude SARS-Cov-2 infection and should not be used as the sole basis for treatment or other patient management decisions. A negative result may occur with  improper specimen collection/handling, submission of specimen other than nasopharyngeal swab, presence of viral mutation(s) within the areas targeted by this assay, and inadequate number of viral copies (<131 copies/mL). A negative result must be combined with clinical observations, patient history, and epidemiological information. The expected result is Negative. Fact Sheet for Patients:  https://www.moore.com/ Fact Sheet for Healthcare Providers:  https://www.young.biz/ This test is not yet  ap proved or cleared by the [place] FDA and  has been authorized for detection and/or diagnosis of SARS-CoV-2 by FDA under an Emergency Use Authorization (EUA). This EUA will remain  in effect (meaning this test can be used) for the duration of the COVID-19 declaration under Section 564(b)(1) of the Act, 21 U.S.C. section 360bbb-3(b)(1), unless the authorization is terminated or revoked sooner.    Influenza A by PCR NEGATIVE NEGATIVE Final   Influenza B by PCR NEGATIVE NEGATIVE Final    Comment: (NOTE) The Xpert Xpress SARS-CoV-2/FLU/RSV assay is intended as an aid in  the diagnosis of influenza from Nasopharyngeal swab specimens and  should not be used as a sole basis for treatment. Nasal washings and  aspirates are unacceptable for Xpert Xpress SARS-CoV-2/FLU/RSV  testing. Fact Sheet for Patients: https://www.moore.com/ Fact Sheet for Healthcare Providers: https://www.young.biz/ This test is not yet approved or cleared by the [place] FDA and  has been authorized for detection and/or diagnosis of SARS-CoV-2 by  FDA under an Emergency Use Authorization (EUA). This EUA will remain  in effect (meaning this test can be used) for the duration of the  Covid-19 declaration under Section 564(b)(1) of the Act, 21  U.S.C. section 360bbb-3(b)(1), unless the authorization is  terminated or revoked. Performed at [facility], [address], [place], [place] [address]      Radiological Exams on Admission: MR ANGIO HEAD WO CONTRAST  Result Date: 04/07/2020 CLINICAL DATA:  Right-sided weakness with slurred speech EXAM: MR HEAD WITHOUT CONTRAST MR CIRCLE OF WILLIS WITHOUT CONTRAST MRA OF THE NECK WITHOUT AND WITH CONTRAST TECHNIQUE: Multiplanar, multiecho pulse sequences of the brain, circle of willis and surrounding structures were obtained without intravenous contrast. Angiographic images of the neck were obtained using MRA technique  without and with intravenous contrast. CONTRAST:  7.64mL GADAVIST GADOBUTROL 1 MMOL/ML IV SOLN COMPARISON:  None. FINDINGS: MRI HEAD FINDINGS BRAIN: Faint area of abnormal diffusion restriction at the left caudate tail. 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 Midline structures are normal. The CSF spaces are normal for age, with no hydrocephalus. Blood-sensitive sequences show no chronic microhemorrhage or superficial siderosis. SKULL AND UPPER CERVICAL SPINE: The visualized skull base, calvarium, upper cervical spine and extracranial soft tissues are normal. SINUSES/ORBITS: No fluid levels or advanced mucosal thickening. No mastoid or middle ear effusion. The orbits are normal. MRA HEAD FINDINGS POSTERIOR CIRCULATION: --Basilar artery: Normal. --Posterior cerebral arteries: Normal. Both originate from the basilar artery. --Superior cerebellar arteries: Normal. --Inferior cerebellar arteries: Normal anterior and posterior inferior cerebellar arteries. ANTERIOR CIRCULATION: --Intracranial internal carotid arteries: Normal. --Anterior cerebral arteries: Normal. Both A1 segments are present. Patent anterior communicating artery. --Middle cerebral arteries: Normal. --Posterior communicating arteries: Diminutive or absent bilaterally. MRA NECK FINDINGS Aortic arch: Normal 3 vessel aortic branching pattern. The visualized subclavian arteries  are normal. Right carotid system: Normal course and caliber without stenosis or evidence of dissection. Left carotid system: Normal course and caliber without stenosis or evidence of dissection. Vertebral arteries: Left dominant. Vertebral artery origins are normal. Vertebral arteries are normal in course and caliber to the vertebrobasilar confluence without stenosis or evidence of dissection. IMPRESSION: 1. Small focus of acute/subacute ischemia at the left caudate tail. No hemorrhage or mass effect. 2. Normal intracranial and  cervical MRA. Electronically Signed   By: Ulyses Jarred M.D.   On: 04/07/2020 04:27   MR ANGIO NECK W WO CONTRAST  Result Date: 04/07/2020 CLINICAL DATA:  Right-sided weakness with slurred speech EXAM: MR HEAD WITHOUT CONTRAST MR CIRCLE OF WILLIS WITHOUT CONTRAST MRA OF THE NECK WITHOUT AND WITH CONTRAST TECHNIQUE: Multiplanar, multiecho pulse sequences of the brain, circle of willis and surrounding structures were obtained without intravenous contrast. Angiographic images of the neck were obtained using MRA technique without and with intravenous contrast. CONTRAST:  7.24mL GADAVIST GADOBUTROL 1 MMOL/ML IV SOLN COMPARISON:  None. FINDINGS: MRI HEAD FINDINGS BRAIN: Faint area of abnormal diffusion restriction at the left caudate tail. 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 Midline structures are normal. The CSF spaces are normal for age, with no hydrocephalus. Blood-sensitive sequences show no chronic microhemorrhage or superficial siderosis. SKULL AND UPPER CERVICAL SPINE: The visualized skull base, calvarium, upper cervical spine and extracranial soft tissues are normal. SINUSES/ORBITS: No fluid levels or advanced mucosal thickening. No mastoid or middle ear effusion. The orbits are normal. MRA HEAD FINDINGS POSTERIOR CIRCULATION: --Basilar artery: Normal. --Posterior cerebral arteries: Normal. Both originate from the basilar artery. --Superior cerebellar arteries: Normal. --Inferior cerebellar arteries: Normal anterior and posterior inferior cerebellar arteries. ANTERIOR CIRCULATION: --Intracranial internal carotid arteries: Normal. --Anterior cerebral arteries: Normal. Both A1 segments are present. Patent anterior communicating artery. --Middle cerebral arteries: Normal. --Posterior communicating arteries: Diminutive or absent bilaterally. MRA NECK FINDINGS Aortic arch: Normal 3 vessel aortic branching pattern. The visualized subclavian arteries are  normal. Right carotid system: Normal course and caliber without stenosis or evidence of dissection. Left carotid system: Normal course and caliber without stenosis or evidence of dissection. Vertebral arteries: Left dominant. Vertebral artery origins are normal. Vertebral arteries are normal in course and caliber to the vertebrobasilar confluence without stenosis or evidence of dissection. IMPRESSION: 1. Small focus of acute/subacute ischemia at the left caudate tail. No hemorrhage or mass effect. 2. Normal intracranial and cervical MRA. Electronically Signed   By: Ulyses Jarred M.D.   On: 04/07/2020 04:27   MR BRAIN WO CONTRAST  Result Date: 04/07/2020 CLINICAL DATA:  Right-sided weakness with slurred speech EXAM: MR HEAD WITHOUT CONTRAST MR CIRCLE OF WILLIS WITHOUT CONTRAST MRA OF THE NECK WITHOUT AND WITH CONTRAST TECHNIQUE: Multiplanar, multiecho pulse sequences of the brain, circle of willis and surrounding structures were obtained without intravenous contrast. Angiographic images of the neck were obtained using MRA technique without and with intravenous contrast. CONTRAST:  7.38mL GADAVIST GADOBUTROL 1 MMOL/ML IV SOLN COMPARISON:  None. FINDINGS: MRI HEAD FINDINGS BRAIN: Faint area of abnormal diffusion restriction at the left caudate tail. 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 Midline structures are normal. The CSF spaces are normal for age, with no hydrocephalus. Blood-sensitive sequences show no chronic microhemorrhage or superficial siderosis. SKULL AND UPPER CERVICAL SPINE: The visualized skull base, calvarium, upper cervical spine and extracranial soft tissues are normal. SINUSES/ORBITS: No fluid levels or advanced mucosal thickening. No mastoid or middle  ear effusion. The orbits are normal. MRA HEAD FINDINGS POSTERIOR CIRCULATION: --Basilar artery: Normal. --Posterior cerebral arteries: Normal. Both originate from the basilar artery. --Superior  cerebellar arteries: Normal. --Inferior cerebellar arteries: Normal anterior and posterior inferior cerebellar arteries. ANTERIOR CIRCULATION: --Intracranial internal carotid arteries: Normal. --Anterior cerebral arteries: Normal. Both A1 segments are present. Patent anterior communicating artery. --Middle cerebral arteries: Normal. --Posterior communicating arteries: Diminutive or absent bilaterally. MRA NECK FINDINGS Aortic arch: Normal 3 vessel aortic branching pattern. The visualized subclavian arteries are normal. Right carotid system: Normal course and caliber without stenosis or evidence of dissection. Left carotid system: Normal course and caliber without stenosis or evidence of dissection. Vertebral arteries: Left dominant. Vertebral artery origins are normal. Vertebral arteries are normal in course and caliber to the vertebrobasilar confluence without stenosis or evidence of dissection. IMPRESSION: 1. Small focus of acute/subacute ischemia at the left caudate tail. No hemorrhage or mass effect. 2. Normal intracranial and cervical MRA. Electronically Signed   By: Deatra Robinson M.D.   On: 04/07/2020 04:27   CT HEAD CODE STROKE WO CONTRAST  Result Date: 04/07/2020 CLINICAL DATA:  Code stroke. Right-sided weakness and slurred speech EXAM: CT HEAD WITHOUT CONTRAST TECHNIQUE: Contiguous axial images were obtained from the base of the skull through the vertex without intravenous contrast. COMPARISON:  None. FINDINGS: Brain: There is no mass, hemorrhage or extra-axial collection. The size and configuration of the ventricles and extra-axial CSF spaces are normal. The brain parenchyma is normal, without evidence of acute or chronic infarction. Vascular: No abnormal hyperdensity of the major intracranial arteries or dural venous sinuses. No intracranial atherosclerosis. Skull: The visualized skull base, calvarium and extracranial soft tissues are normal. Sinuses/Orbits: No fluid levels or advanced mucosal  thickening of the visualized paranasal sinuses. No mastoid or middle ear effusion. The orbits are normal. ASPECTS [facility] Stroke Program Early CT Score) - Ganglionic level infarction (caudate, lentiform nuclei, internal capsule, insula, M1-M3 cortex): 7 - Supraganglionic infarction (M4-M6 cortex): 3 Total score (0-10 with 10 being normal): 10 IMPRESSION: 1. Normal head CT. 2. ASPECTS is 10. * These results were communicated to Dr. Ritta Slot at 1:36 am on 04/07/2020 by text page via the [facility] messaging system. Electronically Signed   By: Deatra Robinson M.D.   On: 04/07/2020 01:36    EKG: Independently reviewed.  Sinus rhythm at 72 bpm   Assessment/Plan CVA: Acute/subacute.  Patient presents with right-sided weakness and slurred speech.  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  Risk factors include hypertension and hyperlipidemia.  Patient takes a 81 mg aspirin daily. -Admit to telemetry bed -Stroke order set initiated -Neuro checks -Check Hemoglobin A1c and lipid panel in a.m. -Check echocardiogram -Full dose aspirin -PT/OT/Speech consult -Social work consult  -Appreciate neurology consultative services, will follow-up for further recommendation   Hypertensive emergency: Acute.  On admission blood pressures noted to be elevated up to 240/97.  Patient had been given 20 mg of labetalol IV.  Home medications include metoprolol 50 mg twice daily.  -Allow for permissive hypertension.  Goal systolic blood pressures less than 220 or diastolic blood pressures less than 120  -Labetalol IV as needed  -Consider restarting metoprolol and 24-48 hours  Hyperlipidemia: Patient with previous history of elevated cholesterol levels, but had issues with muscle weakness when previously tried on rouvastatin and atorvastatin. -Recommend dietary modification and possibly fish oil supplements   Alcohol use: Patient reports drinking Corona lights at home, but does not quantify how  much she drinks.  Denies any history of withdrawal. -CIWA protocols  Elevated AST: Acute.  On admission AST elevated at 46 and ALT within normal limits at 36.  Suspect elevation in AST could be related with patient's reports of alcohol use. -Recommend outpatient monitoring discharge  Obesity: BMI 31.24 kg/m    DVT prophylaxis: Lovenox Code Status: Full Family Communication: Daughter updated at bedside Disposition Plan: To be determined Consults called: Neurology Admission status: Inpatient  Clydie Braun MD [facility] Pager [phone]   If 7PM-7AM, please contact night-coverage [email or web address] Password [facility]  04/07/2020, 7:17 AM

## 2020-04-07 NOTE — ED Notes (Signed)
Attempted report x 3.  ED charge RN stated to have pt taken upstairs and 3W RN could call for report when she was ready.

## 2020-04-07 NOTE — Consult Note (Signed)
Neurology Consultation Reason for Consult: Concern for stroke Referring Physician: Roxanne Mins, D  CC: Shakiness  History is obtained from: Patient  HPI: Andrea George is a 69 y.o. female with a history of hypertension, hypercholesterolemia who presents with acute onset shakiness/lightheadedness/presyncope and some right-sided numbness.  She describes that she was working on some things at home when she had sudden onset of lightheadedness and had to sit down.  Her daughter describes that she was shaking on both sides.  She had some difficulty with speaking and slurred speech, and felt like she was about to pass out.  He then describes some right arm weakness/numbness which is still present.   LKW: 10:30 PM  tpa given?: no, mild symptoms   ROS: A 14 point ROS was performed and is negative except as noted in the HPI.   Past Medical History:  Diagnosis Date  . Heart murmur   . Hypercholesterolemia   . Hypertension   . Tachycardia      No family history on file.   Social History:  reports that she has never smoked. She has never used smokeless tobacco. She reports current alcohol use. She reports that she does not use drugs.   Exam: Current vital signs: BP (!) 229/94   Pulse 75   Temp 98.5 F (36.9 C) (Oral)   Resp (!) 22   Ht 5\' 1"  (1.549 m)   Wt 75 kg   SpO2 97%   BMI 31.24 kg/m  Vital signs in last 24 hours: Temp:  [98 F (36.7 C)-98.5 F (36.9 C)] 98.5 F (36.9 C) (05/01 0138) Pulse Rate:  [75-81] 75 (05/01 0145) Resp:  [18-22] 22 (05/01 0145) BP: (229-240)/(91-97) 229/94 (05/01 0145) SpO2:  [97 %-99 %] 97 % (05/01 0145) Weight:  [75 kg] 75 kg (05/01 0105)   Physical Exam  Constitutional: Appears well-developed and well-nourished.  Psych: Affect appropriate to situation Eyes: No scleral injection HENT: No OP obstrucion MSK: no joint deformities.  Cardiovascular: Normal rate and regular rhythm.  Respiratory: Effort normal, non-labored breathing GI: Soft.  No  distension. There is no tenderness.  Skin: WDI  Neuro: Mental Status: Patient is awake, alert, oriented to person, place, month, year, and situation. Patient is able to give a clear and coherent history. No signs of aphasia or neglect Cranial Nerves: II: Visual Fields are full. Pupils are equal, round, and reactive to light.   III,IV, VI: EOMI without ptosis or diploplia.  V: Facial sensation is symmetric to temperature VII: Facial movement is symmetric.  VIII: hearing is intact to voice X: Uvula elevates symmetrically XI: Shoulder shrug is symmetric. XII: tongue is midline without atrophy or fasciculations.  Motor: She does not have drift in any of her 4 extremities, she of slightly less effort to confrontation in her right arm than her left she is markedly tremulous bilaterally, right greater than left which extinguishes with contralateral distraction Sensory: Sensation is diminished in the right arm Cerebellar: No clear ataxia, but she does have tremulousness that extinguishes with contralateral distraction   I have reviewed labs in epic and the results pertinent to this consultation are: Normal creatinine, normal sodium  I have reviewed the images obtained: CT head-unremarkable  Impression: 69 year old female with acute onset tremulousness/presyncope/right arm numbness happened in the setting of severe hypertension.  The bilateral nature of the tremulousness in combination with lightheadedness make me think that stroke is less likely, though given the blood pressure and the fact that she did have at least 1 focal  symptom (arm weakness) may be think that further imaging is necessary.  Her symptoms are currently mild and therefore I would not treat with IV TPA.  Recommendations: 1) MRI brain 2) could lower blood pressure to less than 220/120 pending MRI, and then if the MRI is negative treat as hypertensive urgency.   Ritta Slot, MD [facility] [phone]  If 7pm- 7am, please page neurology on call as listed in AMION.

## 2020-04-07 NOTE — ED Triage Notes (Signed)
Patient reports slurred speech , lightheaded and " shaky" onset 10: 30 pm this evening , symptoms resolved at arrival . Respirations unlabored .

## 2020-04-07 NOTE — ED Notes (Signed)
Blood has already been gotten from pt in triage

## 2020-04-07 NOTE — Evaluation (Signed)
Physical Therapy Evaluation Patient Details Name: Andrea George MRN: [id number] DOB: [birth year] Today's Date: 04/07/2020   History of Present Illness  69 yo female with recent numbness B hands that disappeared was then admitted with R side weakness and speech slurring.  Had also initially been shaky and light headed, now resolved.  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 Note also AST elevation, obesity dx., hypertensive urgency.  PMHx:  tachycardia, HTN, HLD, heart murmur, carpal tunnel release, EtOH use  Clinical Impression  Pt was seen for evaluation with mod strength and coordination changes on RLE that create difficulty cleaaring foot to walk on side of bed.  Recommending CIR to work toward a better safe gait quality, reduce fall risk and return home with family assistance.  See acutely for these goals and progress as tolerated.    Follow Up Recommendations CIR    Equipment Recommendations  None recommended by PT    Recommendations for Other Services Rehab consult     Precautions / Restrictions Precautions Precautions: Fall Precaution Comments: R side weakness Restrictions Weight Bearing Restrictions: No Other Position/Activity Restrictions: monitor vitals      Mobility  Bed Mobility Overal bed mobility: Needs Assistance Bed Mobility: Supine to Sit;Sit to Supine   Sidelying to sit: Min guard;Min assist Supine to sit: Min assist Sit to supine: Min assist      Transfers Overall transfer level: Needs assistance Equipment used: Rolling walker (2 wheeled);1 person hand held assist Transfers: Sit to/from Stand Sit to Stand: Mod assist            Ambulation/Gait Ambulation/Gait assistance: Min assist;Mod assist Gait Distance (Feet): 8 Feet Assistive device: Rolling walker (2 wheeled);1 person hand held assist Gait Pattern/deviations: Step-to pattern;Decreased stride length;Wide base of support;Trunk flexed Gait velocity: reduced Gait velocity  interpretation: <1.8 ft/sec, indicate of risk for recurrent falls General Gait Details: struggling to abd LLE for gait but can control adduction to step to L side  Stairs            Wheelchair Mobility    Modified Rankin (Stroke Patients Only) Modified Rankin (Stroke Patients Only) Pre-Morbid Rankin Score: No symptoms Modified Rankin: Moderately severe disability     Balance Overall balance assessment: Needs assistance Sitting-balance support: Feet supported Sitting balance-Leahy Scale: Fair     Standing balance support: Bilateral upper extremity supported;During functional activity Standing balance-Leahy Scale: Poor                               Pertinent Vitals/Pain Pain Assessment: No/denies pain    Home Living Family/patient expects to be discharged to:: Private residence Living Arrangements: Children Available Help at Discharge: Family;Available PRN/intermittently Type of Home: House Home Access: Stairs to enter   [employer] of Steps: 2 Home Layout: One level Home Equipment: Environmental consultant - 2 wheels;Crutches Additional Comments: home alone while daugher works as Hotel manager Level of Independence: Independent with assistive device(s)         Comments: no recent falls     Hand Dominance   Dominant Hand: Right    Extremity/Trunk Assessment   Upper Extremity Assessment Upper Extremity Assessment: RUE deficits/detail RUE Deficits / Details: reduced sensation and weak grip    Lower Extremity Assessment Lower Extremity Assessment: RLE deficits/detail RLE Deficits / Details: weakness RLE Coordination: decreased gross motor(mild incoordination)    Cervical / Trunk Assessment Cervical / Trunk Assessment: Normal  Communication   Communication:  Expressive difficulties  Cognition Arousal/Alertness: Awake/alert Behavior During Therapy: WFL for tasks assessed/performed Overall Cognitive Status: Within Functional Limits  for tasks assessed                                 General Comments: able to give som hisory detalis      General Comments      Exercises     Assessment/Plan    PT Assessment Patient needs continued PT services  PT Problem List Decreased strength;Decreased range of motion;Decreased activity tolerance;Decreased balance;Decreased mobility;Decreased coordination;Decreased knowledge of use of DME;Cardiopulmonary status limiting activity       PT Treatment Interventions DME instruction;Gait training;Stair training;Functional mobility training;Therapeutic activities;Therapeutic exercise;Balance training;Neuromuscular re-education;Patient/family education    PT Goals (Current goals can be found in the Care Plan section)  Acute Rehab PT Goals Patient Stated Goal: to get home PT Goal Formulation: With patient/family Time For Goal Achievement: 04/21/20 Potential to Achieve Goals: Good    Frequency Min 5X/week   Barriers to discharge Inaccessible home environment;Decreased caregiver support stairs to enter, limited family help    Co-evaluation               AM-PAC PT "6 Clicks" Mobility  Outcome Measure Help needed turning from your back to your side while in a flat bed without using bedrails?: A Little Help needed moving from lying on your back to sitting on the side of a flat bed without using bedrails?: A Little Help needed moving to and from a bed to a chair (including a wheelchair)?: A Lot Help needed standing up from a chair using your arms (e.g., wheelchair or bedside chair)?: A Lot Help needed to walk in hospital room?: A Lot Help needed climbing 3-5 steps with a railing? : Total 6 Click Score: 13    End of Session Equipment Utilized During Treatment: Gait belt Activity Tolerance: Patient tolerated treatment well;Treatment limited secondary to medical complications (Comment) Patient left: in bed;with call bell/phone within reach;with bed alarm  set Nurse Communication: Mobility status PT Visit Diagnosis: Unsteadiness on feet (R26.81);Muscle weakness (generalized) (M62.81);Difficulty in walking, not elsewhere classified (R26.2);Hemiplegia and hemiparesis Hemiplegia - Right/Left: Right Hemiplegia - dominant/non-dominant: Dominant Hemiplegia - caused by: Cerebral infarction    Time: 4496-7591 PT Time Calculation (min) (ACUTE ONLY): 32 min   Charges:   PT Evaluation $PT Eval Moderate Complexity: 1 Mod PT Treatments $Therapeutic Activity: 8-22 mins        Ivar Drape 04/07/2020, 11:57 PM  Samul Dada, PT MS Acute Rehab Dept. Number: [facility] [id number] and [facility] [phone]

## 2020-04-08 DIAGNOSIS — R7401 Elevation of levels of liver transaminase levels: Secondary | ICD-10-CM | POA: Diagnosis not present

## 2020-04-08 DIAGNOSIS — E782 Mixed hyperlipidemia: Secondary | ICD-10-CM

## 2020-04-08 DIAGNOSIS — I639 Cerebral infarction, unspecified: Secondary | ICD-10-CM | POA: Diagnosis present

## 2020-04-08 DIAGNOSIS — I161 Hypertensive emergency: Secondary | ICD-10-CM | POA: Diagnosis not present

## 2020-04-08 DIAGNOSIS — I63312 Cerebral infarction due to thrombosis of left middle cerebral artery: Secondary | ICD-10-CM | POA: Diagnosis not present

## 2020-04-08 LAB — PHOSPHORUS: Phosphorus: 4.1 mg/dL (ref 2.5–4.6)

## 2020-04-08 MED ORDER — EZETIMIBE 10 MG PO TABS
10.0000 mg | ORAL_TABLET | Freq: Every day | ORAL | 0 refills | Status: DC
Start: 2020-04-08 — End: 2020-05-09

## 2020-04-08 MED ORDER — CLOPIDOGREL BISULFATE 75 MG PO TABS: 75.0000 mg | ORAL_TABLET | Freq: Every day | ORAL | 0 refills | Status: DC

## 2020-04-08 MED ORDER — ASPIRIN 81 MG PO TBEC: 81.0000 mg | DELAYED_RELEASE_TABLET | Freq: Every day | ORAL | 0 refills | Status: AC

## 2020-04-08 MED ORDER — CLOPIDOGREL BISULFATE 75 MG PO TABS
75.0000 mg | ORAL_TABLET | Freq: Every day | ORAL | Status: DC
  Administered 2020-04-08: 75 mg via ORAL
  Filled 2020-04-08: qty 1

## 2020-04-08 MED ORDER — ASPIRIN EC 81 MG PO TBEC
81.0000 mg | DELAYED_RELEASE_TABLET | Freq: Every day | ORAL | Status: DC
  Administered 2020-04-08: 81 mg via ORAL
  Filled 2020-04-08: qty 1

## 2020-04-08 MED ORDER — OMEGA-3-ACID ETHYL ESTERS 1 G PO CAPS
1.0000 g | ORAL_CAPSULE | Freq: Two times a day (BID) | ORAL | 0 refills | Status: DC
Start: 2020-04-08 — End: 2020-05-09

## 2020-04-08 NOTE — Progress Notes (Signed)
Inpatient Rehab Admissions Coordinator Note:   Per therapy recommendations, pt was screened for CIR candidacy by Hoa Briggs, MS CCC-SLP. At this time, Pt. Appears to have functional decline and is a good candidate for CIR. Will place order for rehab consult per protocol.  Please contact me with questions.   Mahreen Schewe, MS, CCC-SLP Rehab Admissions Coordinator  [phone] (celll) [phone] (office)  

## 2020-04-08 NOTE — Progress Notes (Signed)
STROKE TEAM PROGRESS NOTE   INTERVAL HISTORY Her daughter is at the bedside.  Patient sitting in bed, stated that her symptoms has resolved.  PT OT has signed off result recommendations.  Patient has history of hyperlipidemia, not able to tolerating multiple statins and Repatha and Praluent.  She has not tried Zetia in the past.  She also endorses frequent heart palpitations and she is on metoprolol as per her cardiologist in [place].  However, she does not know the diagnosis of palpitation.  She is in agreement for 30-day CardioNet monitoring.  OBJECTIVE Vitals:   04/07/20 2335 04/08/20 0415 04/08/20 0800 04/08/20 1144  BP: (!) 183/78 (!) 149/63 (!) 156/77 (!) 160/98  Pulse: 98 80 80 98  Resp: 19 19 20 20   Temp: 97.8 F (36.6 C) 97.8 F (36.6 C) 97.9 F (36.6 C) (!) 97.4 F (36.3 C)  TempSrc: Oral Oral Oral Oral  SpO2: 99% 99% 98% 99%  Weight:      Height:        CBC:  Recent Labs  Lab 04/07/20 0123 04/07/20 0155  WBC 8.2  --   NEUTROABS 4.9  --   HGB 14.0 13.9  HCT 39.9 41.0  MCV 85.1  --   PLT 172  --     Basic Metabolic Panel:  Recent Labs  Lab 04/07/20 0123 04/07/20 0155 04/07/20 1029 04/08/20 0233  NA 136 137  --   --   K 3.8 3.7  --   --   CL 102 104  --   --   CO2 22  --   --   --   GLUCOSE 102* 95  --   --   BUN 12 14  --   --   CREATININE 0.88 0.80  --   --   CALCIUM 9.8  --   --   --   MG  --   --  2.2  --   PHOS  --   --   --  4.1    Lipid Panel:     Component Value Date/Time   CHOL 312 (H) 04/07/2020 1029   TRIG 787 (H) 04/07/2020 1029   HDL 57 04/07/2020 1029   CHOLHDL 5.5 04/07/2020 1029   VLDL UNABLE TO CALCULATE IF TRIGLYCERIDE OVER 400 mg/dL 06/07/2020 78/58/8502   LDLCALC UNABLE TO CALCULATE IF TRIGLYCERIDE OVER 400 mg/dL 7741 28/78/6767   2094:  Lab Results  Component Value Date   HGBA1C 5.3 04/07/2020   Urine Drug Screen:     Component Value Date/Time   LABOPIA NONE DETECTED 04/07/2020 0720   COCAINSCRNUR NONE DETECTED  04/07/2020 0720   LABBENZ NONE DETECTED 04/07/2020 0720   AMPHETMU NONE DETECTED 04/07/2020 0720   THCU NONE DETECTED 04/07/2020 0720   LABBARB NONE DETECTED 04/07/2020 0720    Alcohol Level     Component Value Date/Time   ETH <10 04/07/2020 0123    IMAGING  MR BRAIN WO CONTRAST MR ANGIO HEAD WO CONTRAST MR ANGIO NECK W WO CONTRAST 04/07/2020 IMPRESSION:  1. Small focus of acute/subacute ischemia at the left caudate tail. No hemorrhage or mass effect.  2. Normal intracranial and cervical MRA.  CT HEAD CODE STROKE WO CONTRAST 04/07/2020 IMPRESSION:  1. Normal head CT.  2. ASPECTS is 10.  DG Chest 2 View 04/07/2020 IMPRESSION:  No acute process in the chest  ECHOCARDIOGRAM COMPLETE 04/07/2020 IMPRESSIONS   1. Left ventricular ejection fraction, by estimation, is 65 to 70%. The left ventricle has normal function.  The left ventricle has no regional wall motion abnormalities. There is moderate concentric left ventricular hypertrophy. Left ventricular diastolic parameters are consistent with Grade I diastolic dysfunction (impaired relaxation). Elevated left atrial pressure.   2. Right ventricular systolic function is normal. The right ventricular size is normal.   3. Left atrial size was mildly dilated.   4. The mitral valve is normal in structure. No evidence of mitral valve regurgitation. No evidence of mitral stenosis.   5. The aortic valve is normal in structure. Aortic valve regurgitation is mild. Mild aortic valve stenosis.   6. The inferior vena cava is normal in size with greater than 50% respiratory variability, suggesting right atrial pressure of 3 mmHg. Conclusion(s)/Recommendation(s): No intracardiac source of embolism detected on this transthoracic study. A transesophageal echocardiogram is recommended to exclude cardiac source of embolism if clinically indicated.   ECG - SR rate 72 BPM. (See cardiology reading for complete details)   PHYSICAL EXAM  Temp:  [97.4 F  (36.3 C)-98 F (36.7 C)] 97.4 F (36.3 C) (05/02 1144) Pulse Rate:  [80-98] 98 (05/02 1144) Resp:  [18-20] 20 (05/02 1144) BP: (149-183)/(63-98) 160/98 (05/02 1144) SpO2:  [97 %-99 %] 99 % (05/02 1144)  General - Well nourished, well developed, in no apparent distress.  Ophthalmologic - fundi not visualized due to noncooperation.  Cardiovascular - Regular rhythm and rate.  Mental Status -  Level of arousal and orientation to time, place, and person were intact. Language including expression, naming, repetition, comprehension was assessed and found intact. Fund of Knowledge was assessed and was intact.  Cranial Nerves II - XII - II - Visual field intact OU. III, IV, VI - Extraocular movements intact. V - Facial sensation intact bilaterally. VII - Facial movement intact bilaterally. VIII - Hearing & vestibular intact bilaterally. X - Palate elevates symmetrically. XI - Chin turning & shoulder shrug intact bilaterally. XII - Tongue protrusion intact.  Motor Strength - The patient's strength was normal in all extremities and pronator drift was absent.  Bulk was normal and fasciculations were absent.   Motor Tone - Muscle tone was assessed at the neck and appendages and was normal.  Reflexes - The patient's reflexes were symmetrical in all extremities and she had no pathological reflexes.  Sensory - Light touch, temperature/pinprick were assessed and were symmetrical.    Coordination - The patient had normal movements in the hands and feet with no ataxia or dysmetria.  Tremor was absent.  Gait and Station - deferred.   ASSESSMENT/PLAN [name] is a 69 y.o. female with history of hypertension, tachycardia, heart murmer, statin intolerance and hypercholesterolemia who presents with acute onset shakiness/lightheadedness/presyncope, speech difficulties and some right-sided numbness.. She did not receive IV t-PA due to mild deficits.   Stroke: Small left BG/CR infarct -  likely due to small vessel disease.  However, given her frequent heart palpitation, will do a 30-day Cardiac event monitoring.  CT Head - Normal head CT. ASPECTS is 10.  MRI head - Small focus of acute/subacute ischemia at the left caudate tail. No hemorrhage or mass effect.   MRA head and neck- Normal intracranial and cervical MRA.  2D Echo - EF 65 - 70%. No cardiac source of emboli identified.   Sars Corona Virus 2 - negative  LDL 126.6, TG 787  HgbA1c - 5.3  UDS - negative  VTE prophylaxis - Lovenox  aspirin 81 mg daily prior to admission, now on aspirin 81 mg daily and clopidogrel 75 mg  daily.  Continue DAPT for 3 weeks and then Plavix alone.  Patient counseled to be compliant with her antithrombotic medications  Ongoing aggressive stroke risk factor management  Therapy recommendations:  pending  Disposition:  Pending  Heart palpitation  Frequent heart palpitation every other day  Was told to be tachycardia arrhythmia in [place]  On metoprolol prescribed by her cardiologist in [place]  Recommend 30-day cardiac event monitoring to rule out A. Fib  Recommend follow-up with cardiology in [facility]  Hypertensive urgency  Home BP meds: metoprolol  Current BP meds: none  BP 240s on presentation  Blood pressure currently 160-190 . Permissive hypertension (OK if < 220/120) but gradually normalize in 2-3 days  . Long-term BP goal normotensive  Hyperlipidemia  Home Lipid lowering medication: none   LDL 126.6, goal < 70  TG 787  Current lipid lowering medication: Zetia and fish oil  Continue Zetia and fish oil on discharge  Hx of statin, Repatha and Praluent intolerance  Recommend to follow-up with cardiology lipid clinic in [place]  Other Stroke Risk Factors  Advanced age  ETOH use, on CIWA protocol, advised to drink no more than 1 alcoholic beverage per day.  Obesity, Body mass index is 31.24 kg/m., recommend weight loss, diet and exercise  as appropriate   Other Active Problems  Code status - Full code  Hospital day # 1  Neurology will sign off. Please call with questions. Pt will follow up with stroke clinic NP at [facility] in about 4 weeks. Thanks for the consult.  Marvel Plan, MD PhD Stroke Neurology 04/08/2020 3:26 PM    To contact Stroke Continuity provider, please refer to [email or web address]. After hours, contact General Neurology

## 2020-04-08 NOTE — Progress Notes (Signed)
Inpatient Rehab Admissions Coordinator:   Met with patient at bedside to discuss potential CIR admission. She states she is moving much better and her speech and cognition are at baseline. Prior to discussion, Pt. Had walked ~253f. Min guard with therapist and OT has now changed recs to home health. I do not think that Pt. Has medical necessity for CIR admit and Pt. Is in agreement and wishes to be discharged home with home health. I will sign off at this time  LClemens Catholic [place] CAldenAdmissions Coordinator  [phone](celll) [phone](office)

## 2020-04-08 NOTE — Care Management Obs Status (Signed)
MEDICARE OBSERVATION STATUS NOTIFICATION   Patient Details  Name: Andrea George MRN: [id number] Date of Birth: 1951-03-28   Medicare Observation Status Notification Given:  Yes    Lawerance Sabal, RN 04/08/2020, 3:38 PM

## 2020-04-08 NOTE — Progress Notes (Signed)
Patient arrived to unit A/o x4. Vital signs taken.Daughter at bedside. Bed left in low position, alarm on, call bell within reach. Telemetry initiated and verified. Melony Overly, RN

## 2020-04-08 NOTE — TOC Transition Note (Signed)
Transition of Care [facility]) - CM/SW Discharge Note   Patient Details  Name: Clariece Roesler MRN: [id number] Date of Birth: 07-02-1951  Transition of Care [facility] CM/SW Contact:  Lawerance Sabal, RN Phone Number: 04/08/2020, 2:40 PM   Clinical Narrative:    Referral accepted to [facility]. Patient would like 3/1, order placed and requested to be brought to room prior to DC.     Final next level of care: Home w Home Health Services Barriers to Discharge: No Barriers Identified   Patient Goals and CMS Choice Patient states their goals for this hospitalization and ongoing recovery are:: to go home CMS Medicare.gov Compare Post Acute Care list provided to:: Patient Choice offered to / list presented to : Patient  Discharge Placement                       Discharge Plan and Services                DME Arranged: 3-N-1 DME Agency: [facility] Date DME Agency Contacted: 04/08/20 Time DME Agency Contacted: 1440 Representative spoke with at DME Agency: [facility] HH Arranged: PT, OT HH Agency: [facility] Date HH Agency Contacted: 04/08/20 Time HH Agency Contacted: 1440 Representative spoke with at [facility] Agency: Barbara Cower  Social Determinants of Health (SDOH) Interventions     Readmission Risk Interventions No flowsheet data found.

## 2020-04-08 NOTE — Discharge Summary (Signed)
Discharge Summary  Andrea George XNA:[id number] DOB: 02-04-1951  PCP: Elder Cyphers, MD  Admit date: 04/07/2020 Discharge date: 04/08/2020  Time spent: 12mins, more than 50% time spent on coordination of care.  Recommendations for Outpatient Follow-up:  1. F/u with PCP within a week  for hospital discharge follow up, repeat cbc/bmp at follow up 2. F/u with neurology 3. F/u with cardiology 4. Home health ordered at discharge  Discharge Diagnoses:  Active Hospital Problems   Diagnosis Date Noted   CVA (cerebral vascular accident) ([place]) 04/07/2020   Hypertensive emergency 04/07/2020   Hyperlipidemia 04/07/2020   Obesity (BMI 30.0-34.9) 04/07/2020   Elevated AST (SGOT) 04/07/2020   Alcohol use 04/07/2020    Resolved Hospital Problems  No resolved problems to display.    Discharge Condition: stable  Diet recommendation: heart healthy  Filed Weights   04/07/20 0105  Weight: 75 kg    History of present illness: (per admitting MD Tamala Julian)  PCP: Elder Cyphers, MD  Patient coming from: Home  Chief Complaint: Right-sided weakness and slurred speech  I have personally briefly reviewed patient's old medical records in [place]   HPI: Andrea George is a 69 y.o. right hand dominant  female with medical history significant of hypertension, hyperlipidemia, and palpitations presents with complaints of right-sided weakness and slurred speech.  Around 4 PM yesterday patient had complained of a headache and had taken 2 Aleve and laid down.  She was able to go to [facility] later that evening, but when she returned around 10:30PM had complained of feeling shaky and lightheaded.  Patient intermittently gets lightheaded from time to time, but thought that her blood sugar was possibly low low.  Her daughter gave her a soda to drink, but noticed that her speech was slurred.  The patient noticed some right-sided numbness/weakness for which she felt unsteady.  Normally  patient is able to ambulate and complete all of her ADLs without assistance.  Her daughter convinced her to come into the emergency department for further evaluation.  Denies having any recent fever,, nausea, vomiting, diarrhea, or dysuria.  She also reports that she drinks Corona light, but denies any history of withdrawal symptoms.  ED Course: Upon admission into the emergency department patient presented as a code stroke.  Neurology have been consulted.  CT of the brain was within normal limits.  She was not a TPA candidate.  Vitals significant for heart rate 75-105, respiration 15-25, blood pressure elevated up to 240/97, and all other vital signs within normal limits.  Labs were relatively unremarkable.  Influenza and COVID-19 screen negative.  Patient had received labetalol 20 mg once and Ativan 1 mg IV. MRI of the brain and MRA of the head neck revealed a small acute/subacute infarct of the left caudate tail.  Hospital Course:  Principal Problem:   CVA (cerebral vascular accident) [facility]) Active Problems:   Hypertensive emergency   Hyperlipidemia   Obesity (BMI 30.0-34.9)   Elevated AST (SGOT)   Alcohol use  Acute CVA : -acute onset shakiness/lightheadedness/presyncope, speech difficulties and some right-sided numbness. She did not receive IV t-PA due to mild deficits -MRI showed small focus of acute/subacute ischemia at the left caudate tail. -Echocardiogram no cardioembolic sources -R4Y 5.3 -LDL unable to calculate due to elevated triglyceride -Neurology consulted -per neurology-acute CVA , likely due to small vessel disease, but patient also report palpitation, will follow cardiology for 30-day event monitor to rule out A. Fib, message sent to cards master -She is to take aspirin  plus Plavix for 3 weeks, then Plavix alone -History of statin and Repatha intolerance, she is discharged on Lovaza and Zetia -Follow-up with neurology  Hyperlipidemia -History of statin intolerance,  Repatha intolerance -Discharge Lovaza and Zetia -Referred to lipid clinic   Intermittent palpitation, intermittent chest pain, history of mild aortic stenosis -She report followed by her cardiology in [place], she takes Lopressor -Echocardiogram left ventricular EF within normal limit, grade 1 diastolic dysfunction, mild aortic stenosis and regurgitation -She does have a soft murmur at right upper sternal  Border -Referred to cardiology, message sent to cards master  Hypertension -Continue Lopressor  Mild elevation of LFT AST 46, possibly due to alcohol Follow-up with PCP  Procedures:  None  Consultations:  Neurology  Discharge Exam: BP (!) 160/98 (BP Location: Right Arm)    Pulse 98    Temp (!) 97.4 F (36.3 C) (Oral)    Resp 20    Ht 5\' 1"  (1.549 m)    Wt 75 kg    SpO2 99%    BMI 31.24 kg/m   General: NAD, pleasant, speech is clear Cardiovascular: RRR, soft murmur at right upper sternal border Respiratory: Clear to auscultation bilaterally  Discharge Instructions You were cared for by a hospitalist during your hospital stay. If you have any questions about your discharge medications or the care you received while you were in the hospital after you are discharged, you can call the unit and asked to speak with the hospitalist on call if the hospitalist that took care of you is not available. Once you are discharged, your primary care physician will handle any further medical issues. Please note that NO REFILLS for any discharge medications will be authorized once you are discharged, as it is imperative that you return to your primary care physician (or establish a relationship with a primary care physician if you do not have one) for your aftercare needs so that they can reassess your need for medications and monitor your lab values.  Discharge Instructions    Ambulatory referral to Neurology   Complete by: As directed    An appointment is requested in approximately: 4  weeks Stroke clinic   Diet - low sodium heart healthy   Complete by: As directed    Increase activity slowly   Complete by: As directed      Allergies as of 04/08/2020      Reactions   Evolocumab Anaphylaxis   Atorvastatin Other (See Comments)   Statins    Bone pain      Medication List    TAKE these medications   aspirin 81 MG EC tablet Take 1 tablet (81 mg total) by mouth daily for 21 days.   clopidogrel 75 MG tablet Commonly known as: PLAVIX Take 1 tablet (75 mg total) by mouth daily. Start taking on: Apr 09, 2020   ezetimibe 10 MG tablet Commonly known as: Zetia Take 1 tablet (10 mg total) by mouth daily.   metoprolol tartrate 50 MG tablet Commonly known as: LOPRESSOR Take 50 mg by mouth 2 (two) times daily.   omega-3 acid ethyl esters 1 g capsule Commonly known as: Lovaza Take 1 capsule (1 g total) by mouth 2 (two) times daily.            Durable Medical Equipment  (From admission, onward)         Start     Ordered   04/08/20 1439  For home use only DME 3 n 1  Once  04/08/20 1438         Allergies  Allergen Reactions   Evolocumab Anaphylaxis   Atorvastatin Other (See Comments)   Statins     Bone pain   Follow-up Information    [facility] Follow up in 1 month(s).   Specialty: Cardiology Why: for chest pain , plapitation, mlld arotic stenosis,  Contact information: [address], [address] [place] [address] [phone]       [facility] Follow up in 1 month(s).   Contact information: [address]     [address] [place] [address] [phone]       [facility] .   Specialty: Neurology Contact information: [address] [place] [address] [phone]       f/u with pcp Follow up in 1 week(s).   Why: hospital discharge follow up       [place] Follow up.    Why: They will call you in the next 1-2 days to set up your first home health appointment Contact information: [address] [place] [address] [phone]            The results of significant diagnostics from this hospitalization (including imaging, microbiology, ancillary and laboratory) are listed below for reference.    Significant Diagnostic Studies: DG Chest 2 View  Result Date: 04/07/2020 CLINICAL DATA:  Slurred speech lightheaded EXAM: CHEST - 2 VIEW COMPARISON:  None. FINDINGS: The heart size and mediastinal contours are within normal limits. Both lungs are clear. Likely chronic mild interstitial prominence. No pleural effusion or pneumothorax. The visualized skeletal structures are unremarkable. IMPRESSION: No acute process in the chest. Electronically Signed   By: Guadlupe Spanish M.D.   On: 04/07/2020 08:09   MR ANGIO HEAD WO CONTRAST  Result Date: 04/07/2020 CLINICAL DATA:  Right-sided weakness with slurred speech EXAM: MR HEAD WITHOUT CONTRAST MR CIRCLE OF WILLIS WITHOUT CONTRAST MRA OF THE NECK WITHOUT AND WITH CONTRAST TECHNIQUE: Multiplanar, multiecho pulse sequences of the brain, circle of willis and surrounding structures were obtained without intravenous contrast. Angiographic images of the neck were obtained using MRA technique without and with intravenous contrast. CONTRAST:  7.73mL GADAVIST GADOBUTROL 1 MMOL/ML IV SOLN COMPARISON:  None. FINDINGS: MRI HEAD FINDINGS BRAIN: Faint area of abnormal diffusion restriction at the left caudate tail. 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 Midline structures are normal. The CSF spaces are normal for age, with no hydrocephalus. Blood-sensitive sequences show no chronic microhemorrhage or superficial siderosis. SKULL AND UPPER CERVICAL SPINE: The visualized skull base, calvarium, upper cervical spine and extracranial soft tissues are normal. SINUSES/ORBITS: No fluid  levels or advanced mucosal thickening. No mastoid or middle ear effusion. The orbits are normal. MRA HEAD FINDINGS POSTERIOR CIRCULATION: --Basilar artery: Normal. --Posterior cerebral arteries: Normal. Both originate from the basilar artery. --Superior cerebellar arteries: Normal. --Inferior cerebellar arteries: Normal anterior and posterior inferior cerebellar arteries. ANTERIOR CIRCULATION: --Intracranial internal carotid arteries: Normal. --Anterior cerebral arteries: Normal. Both A1 segments are present. Patent anterior communicating artery. --Middle cerebral arteries: Normal. --Posterior communicating arteries: Diminutive or absent bilaterally. MRA NECK FINDINGS Aortic arch: Normal 3 vessel aortic branching pattern. The visualized subclavian arteries are normal. Right carotid system: Normal course and caliber without stenosis or evidence of dissection. Left carotid system: Normal course and caliber without stenosis or evidence of dissection. Vertebral arteries: Left dominant. Vertebral artery origins are normal. Vertebral arteries  are normal in course and caliber to the vertebrobasilar confluence without stenosis or evidence of dissection. IMPRESSION: 1. Small focus of acute/subacute ischemia at the left caudate tail. No hemorrhage or mass effect. 2. Normal intracranial and cervical MRA. Electronically Signed   By: Deatra Robinson M.D.   On: 04/07/2020 04:27   MR ANGIO NECK W WO CONTRAST  Result Date: 04/07/2020 CLINICAL DATA:  Right-sided weakness with slurred speech EXAM: MR HEAD WITHOUT CONTRAST MR CIRCLE OF WILLIS WITHOUT CONTRAST MRA OF THE NECK WITHOUT AND WITH CONTRAST TECHNIQUE: Multiplanar, multiecho pulse sequences of the brain, circle of willis and surrounding structures were obtained without intravenous contrast. Angiographic images of the neck were obtained using MRA technique without and with intravenous contrast. CONTRAST:  7.51mL GADAVIST GADOBUTROL 1 MMOL/ML IV SOLN COMPARISON:  None.  FINDINGS: MRI HEAD FINDINGS BRAIN: Faint area of abnormal diffusion restriction at the left caudate tail. 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 Midline structures are normal. The CSF spaces are normal for age, with no hydrocephalus. Blood-sensitive sequences show no chronic microhemorrhage or superficial siderosis. SKULL AND UPPER CERVICAL SPINE: The visualized skull base, calvarium, upper cervical spine and extracranial soft tissues are normal. SINUSES/ORBITS: No fluid levels or advanced mucosal thickening. No mastoid or middle ear effusion. The orbits are normal. MRA HEAD FINDINGS POSTERIOR CIRCULATION: --Basilar artery: Normal. --Posterior cerebral arteries: Normal. Both originate from the basilar artery. --Superior cerebellar arteries: Normal. --Inferior cerebellar arteries: Normal anterior and posterior inferior cerebellar arteries. ANTERIOR CIRCULATION: --Intracranial internal carotid arteries: Normal. --Anterior cerebral arteries: Normal. Both A1 segments are present. Patent anterior communicating artery. --Middle cerebral arteries: Normal. --Posterior communicating arteries: Diminutive or absent bilaterally. MRA NECK FINDINGS Aortic arch: Normal 3 vessel aortic branching pattern. The visualized subclavian arteries are normal. Right carotid system: Normal course and caliber without stenosis or evidence of dissection. Left carotid system: Normal course and caliber without stenosis or evidence of dissection. Vertebral arteries: Left dominant. Vertebral artery origins are normal. Vertebral arteries are normal in course and caliber to the vertebrobasilar confluence without stenosis or evidence of dissection. IMPRESSION: 1. Small focus of acute/subacute ischemia at the left caudate tail. No hemorrhage or mass effect. 2. Normal intracranial and cervical MRA. Electronically Signed   By: Deatra Robinson M.D.   On: 04/07/2020 04:27   MR BRAIN WO  CONTRAST  Result Date: 04/07/2020 CLINICAL DATA:  Right-sided weakness with slurred speech EXAM: MR HEAD WITHOUT CONTRAST MR CIRCLE OF WILLIS WITHOUT CONTRAST MRA OF THE NECK WITHOUT AND WITH CONTRAST TECHNIQUE: Multiplanar, multiecho pulse sequences of the brain, circle of willis and surrounding structures were obtained without intravenous contrast. Angiographic images of the neck were obtained using MRA technique without and with intravenous contrast. CONTRAST:  7.71mL GADAVIST GADOBUTROL 1 MMOL/ML IV SOLN COMPARISON:  None. FINDINGS: MRI HEAD FINDINGS BRAIN: Faint area of abnormal diffusion restriction at the left caudate tail. 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 Midline structures are normal. The CSF spaces are normal for age, with no hydrocephalus. Blood-sensitive sequences show no chronic microhemorrhage or superficial siderosis. SKULL AND UPPER CERVICAL SPINE: The visualized skull base, calvarium, upper cervical spine and extracranial soft tissues are normal. SINUSES/ORBITS: No fluid levels or advanced mucosal thickening. No mastoid or middle ear effusion. The orbits are normal. MRA HEAD FINDINGS POSTERIOR CIRCULATION: --Basilar artery: Normal. --Posterior cerebral arteries: Normal. Both originate from the basilar artery. --Superior cerebellar arteries: Normal. --Inferior cerebellar arteries: Normal anterior and posterior inferior cerebellar arteries. ANTERIOR  CIRCULATION: --Intracranial internal carotid arteries: Normal. --Anterior cerebral arteries: Normal. Both A1 segments are present. Patent anterior communicating artery. --Middle cerebral arteries: Normal. --Posterior communicating arteries: Diminutive or absent bilaterally. MRA NECK FINDINGS Aortic arch: Normal 3 vessel aortic branching pattern. The visualized subclavian arteries are normal. Right carotid system: Normal course and caliber without stenosis or evidence of dissection. Left carotid  system: Normal course and caliber without stenosis or evidence of dissection. Vertebral arteries: Left dominant. Vertebral artery origins are normal. Vertebral arteries are normal in course and caliber to the vertebrobasilar confluence without stenosis or evidence of dissection. IMPRESSION: 1. Small focus of acute/subacute ischemia at the left caudate tail. No hemorrhage or mass effect. 2. Normal intracranial and cervical MRA. Electronically Signed   By: Deatra Robinson M.D.   On: 04/07/2020 04:27   ECHOCARDIOGRAM COMPLETE  Result Date: 04/07/2020    ECHOCARDIOGRAM REPORT   Patient Name:   Andrea George Date of Exam: 04/07/2020 Medical Rec #:  [id number]      Height:       61.0 in Accession #:    [id number]     Weight:       165.3 lb Date of Birth:  1951-04-10     BSA:          1.742 m Patient Age:    68 years       BP:           158/57 mmHg Patient Gender: F              HR:           77 bpm. Exam Location:  Inpatient Procedure: 2D Echo, Cardiac Doppler, Color Doppler and Intracardiac            Opacification Agent Indications:    Stroke 434.91/I163.9  History:        Patient has no prior history of Echocardiogram examinations.                 Risk Factors:Hypertension and Dyslipidemia.  Sonographer:    Ross Ludwig RDCS (AE) Referring Phys: [id number] [facility] A SMITH  Sonographer Comments: Suboptimal parasternal window, suboptimal apical window, suboptimal subcostal window and patient is morbidly obese. IMPRESSIONS  1. Left ventricular ejection fraction, by estimation, is 65 to 70%. The left ventricle has normal function. The left ventricle has no regional wall motion abnormalities. There is moderate concentric left ventricular hypertrophy. Left ventricular diastolic parameters are consistent with Grade I diastolic dysfunction (impaired relaxation). Elevated left atrial pressure.  2. Right ventricular systolic function is normal. The right ventricular size is normal.  3. Left atrial size was mildly dilated.  4. The  mitral valve is normal in structure. No evidence of mitral valve regurgitation. No evidence of mitral stenosis.  5. The aortic valve is normal in structure. Aortic valve regurgitation is mild. Mild aortic valve stenosis.  6. The inferior vena cava is normal in size with greater than 50% respiratory variability, suggesting right atrial pressure of 3 mmHg. Conclusion(s)/Recommendation(s): No intracardiac source of embolism detected on this transthoracic study. A transesophageal echocardiogram is recommended to exclude cardiac source of embolism if clinically indicated. FINDINGS  Left Ventricle: Left ventricular ejection fraction, by estimation, is 65 to 70%. The left ventricle has normal function. The left ventricle has no regional wall motion abnormalities. Definity contrast agent was given IV to delineate the left ventricular  endocardial borders. The left ventricular internal cavity size was normal in size. There is moderate concentric left ventricular hypertrophy. Left  ventricular diastolic parameters are consistent with Grade I diastolic dysfunction (impaired relaxation). Elevated left atrial pressure. Right Ventricle: The right ventricular size is normal. No increase in right ventricular wall thickness. Right ventricular systolic function is normal. Left Atrium: Left atrial size was mildly dilated. Right Atrium: Right atrial size was normal in size. Pericardium: There is no evidence of pericardial effusion. Mitral Valve: The mitral valve is normal in structure. There is mild thickening of the mitral valve leaflet(s). There is mild calcification of the mitral valve leaflet(s). Normal mobility of the mitral valve leaflets. Moderate mitral annular calcification. No evidence of mitral valve regurgitation. No evidence of mitral valve stenosis. MV peak gradient, 6.9 mmHg. The mean mitral valve gradient is 2.0 mmHg. Tricuspid Valve: The tricuspid valve is normal in structure. Tricuspid valve regurgitation is not  demonstrated. No evidence of tricuspid stenosis. Aortic Valve: The aortic valve is normal in structure.. There is moderate thickening and moderate calcification of the aortic valve. Aortic valve regurgitation is mild. Aortic regurgitation PHT measures 744 msec. Mild aortic stenosis is present. There is  moderate thickening of the aortic valve. There is moderate calcification of the aortic valve. Aortic valve mean gradient measures 15.3 mmHg. Aortic valve peak gradient measures 24.3 mmHg. Aortic valve area, by VTI measures 1.42 cm. Pulmonic Valve: The pulmonic valve was normal in structure. Pulmonic valve regurgitation is not visualized. No evidence of pulmonic stenosis. Aorta: The aortic root is normal in size and structure. Venous: The inferior vena cava is normal in size with greater than 50% respiratory variability, suggesting right atrial pressure of 3 mmHg. IAS/Shunts: No atrial level shunt detected by color flow Doppler.  LEFT VENTRICLE PLAX 2D LVIDd:         3.10 cm  Diastology LVIDs:         2.20 cm  LV e' lateral:   5.70 cm/s LV PW:         1.20 cm  LV E/e' lateral: 13.3 LV IVS:        1.50 cm  LV e' medial:    4.13 cm/s LVOT diam:     1.80 cm  LV E/e' medial:  18.3 LV SV:         76 LV SV Index:   44 LVOT Area:     2.54 cm  RIGHT VENTRICLE RV Basal diam:  2.10 cm RV S prime:     13.00 cm/s TAPSE (M-mode): 1.9 cm LEFT ATRIUM             Index       RIGHT ATRIUM          Index LA diam:        4.10 cm 2.35 cm/m  RA Area:     9.35 cm LA Vol (A2C):   54.0 ml 31.00 ml/m RA Volume:   15.20 ml 8.73 ml/m LA Vol (A4C):   64.4 ml 36.97 ml/m LA Biplane Vol: 60.2 ml 34.56 ml/m  AORTIC VALVE AV Area (Vmax):    1.51 cm AV Area (Vmean):   1.48 cm AV Area (VTI):     1.42 cm AV Vmax:           246.67 cm/s AV Vmean:          183.667 cm/s AV VTI:            0.536 m AV Peak Grad:      24.3 mmHg AV Mean Grad:      15.3 mmHg LVOT Vmax:  146.00 cm/s LVOT Vmean:        107.000 cm/s LVOT VTI:          0.300 m  LVOT/AV VTI ratio: 0.56 AI PHT:            744 msec  AORTA Ao Root diam: 2.60 cm MITRAL VALVE MV Area (PHT): 2.34 cm    SHUNTS MV Peak grad:  6.9 mmHg    Systemic VTI:  0.30 m MV Mean grad:  2.0 mmHg    Systemic Diam: 1.80 cm MV Vmax:       1.31 m/s MV Vmean:      70.1 cm/s MV Decel Time: 324 msec MV E velocity: 75.70 cm/s MV A velocity: 92.60 cm/s MV E/A ratio:  0.82 Tobias Alexander MD Electronically signed by Tobias Alexander MD Signature Date/Time: 04/07/2020/3:54:04 PM    Final    CT HEAD CODE STROKE WO CONTRAST  Result Date: 04/07/2020 CLINICAL DATA:  Code stroke. Right-sided weakness and slurred speech EXAM: CT HEAD WITHOUT CONTRAST TECHNIQUE: Contiguous axial images were obtained from the base of the skull through the vertex without intravenous contrast. COMPARISON:  None. FINDINGS: Brain: There is no mass, hemorrhage or extra-axial collection. The size and configuration of the ventricles and extra-axial CSF spaces are normal. The brain parenchyma is normal, without evidence of acute or chronic infarction. Vascular: No abnormal hyperdensity of the major intracranial arteries or dural venous sinuses. No intracranial atherosclerosis. Skull: The visualized skull base, calvarium and extracranial soft tissues are normal. Sinuses/Orbits: No fluid levels or advanced mucosal thickening of the visualized paranasal sinuses. No mastoid or middle ear effusion. The orbits are normal. ASPECTS [facility] Stroke Program Early CT Score) - Ganglionic level infarction (caudate, lentiform nuclei, internal capsule, insula, M1-M3 cortex): 7 - Supraganglionic infarction (M4-M6 cortex): 3 Total score (0-10 with 10 being normal): 10 IMPRESSION: 1. Normal head CT. 2. ASPECTS is 10. * These results were communicated to Dr. Ritta Slot at 1:36 am on 04/07/2020 by text page via the [facility] messaging system. Electronically Signed   By: Deatra Robinson M.D.   On: 04/07/2020 01:36    Microbiology: Recent Results (from the past 240  hour(s))  Respiratory Panel by RT PCR (Flu A&B, Covid) - Nasopharyngeal Swab     Status: None   Collection Time: 04/07/20  1:54 AM   Specimen: Nasopharyngeal Swab  Result Value Ref Range Status   SARS Coronavirus 2 by RT PCR NEGATIVE NEGATIVE Final    Comment: (NOTE) SARS-CoV-2 target nucleic acids are NOT DETECTED. The SARS-CoV-2 RNA is generally detectable in upper respiratoy specimens during the acute phase of infection. The lowest concentration of SARS-CoV-2 viral copies this assay can detect is 131 copies/mL. A negative result does not preclude SARS-Cov-2 infection and should not be used as the sole basis for treatment or other patient management decisions. A negative result may occur with  improper specimen collection/handling, submission of specimen other than nasopharyngeal swab, presence of viral mutation(s) within the areas targeted by this assay, and inadequate number of viral copies (<131 copies/mL). A negative result must be combined with clinical observations, patient history, and epidemiological information. The expected result is Negative. Fact Sheet for Patients:  https://www.moore.com/ Fact Sheet for Healthcare Providers:  https://www.young.biz/ This test is not yet ap proved or cleared by the [place] FDA and  has been authorized for detection and/or diagnosis of SARS-CoV-2 by FDA under an Emergency Use Authorization (EUA). This EUA will remain  in effect (meaning this test can be  used) for the duration of the COVID-19 declaration under Section 564(b)(1) of the Act, 21 U.S.C. section 360bbb-3(b)(1), unless the authorization is terminated or revoked sooner.    Influenza A by PCR NEGATIVE NEGATIVE Final   Influenza B by PCR NEGATIVE NEGATIVE Final    Comment: (NOTE) The Xpert Xpress SARS-CoV-2/FLU/RSV assay is intended as an aid in  the diagnosis of influenza from Nasopharyngeal swab specimens and  should not be used as  a sole basis for treatment. Nasal washings and  aspirates are unacceptable for Xpert Xpress SARS-CoV-2/FLU/RSV  testing. Fact Sheet for Patients: https://www.moore.com/https://www.fda.gov/media/142436/download Fact Sheet for Healthcare Providers: https://www.young.biz/https://www.fda.gov/media/142435/download This test is not yet approved or cleared by the [place] FDA and  has been authorized for detection and/or diagnosis of SARS-CoV-2 by  FDA under an Emergency Use Authorization (EUA). This EUA will remain  in effect (meaning this test can be used) for the duration of the  Covid-19 declaration under Section 564(b)(1) of the Act, 21  U.S.C. section 360bbb-3(b)(1), unless the authorization is  terminated or revoked. Performed at [facility], [address] [place], [place] [address]      Labs: Basic Metabolic Panel: Recent Labs  Lab 04/07/20 0123 04/07/20 0155 04/07/20 1029 04/08/20 0233  NA 136 137  --   --   K 3.8 3.7  --   --   CL 102 104  --   --   CO2 22  --   --   --   GLUCOSE 102* 95  --   --   BUN 12 14  --   --   CREATININE 0.88 0.80  --   --   CALCIUM 9.8  --   --   --   MG  --   --  2.2  --   PHOS  --   --   --  4.1   Liver Function Tests: Recent Labs  Lab 04/07/20 0123  AST 46*  ALT 36  ALKPHOS 76  BILITOT 0.7  PROT 8.0  ALBUMIN 4.4   No results for input(s): LIPASE, AMYLASE in the last 168 hours. No results for input(s): AMMONIA in the last 168 hours. CBC: Recent Labs  Lab 04/07/20 0123 04/07/20 0155  WBC 8.2  --   NEUTROABS 4.9  --   HGB 14.0 13.9  HCT 39.9 41.0  MCV 85.1  --   PLT 172  --    Cardiac Enzymes: No results for input(s): CKTOTAL, CKMB, CKMBINDEX, TROPONINI in the last 168 hours. BNP: BNP (last 3 results) No results for input(s): BNP in the last 8760 hours.  ProBNP (last 3 results) No results for input(s): PROBNP in the last 8760 hours.  CBG: Recent Labs  Lab 04/07/20 0059  GLUCAP 93       Signed:  Albertine GratesFang Reynard Christoffersen MD, PhD, FACP  [facility] 04/08/2020, 2:46 PM

## 2020-04-08 NOTE — Progress Notes (Signed)
Occupational Therapy Evaluation Patient Details Name: Andrea George MRN: [id number] DOB: [birth year] Today's Date: 04/08/2020    History of Present Illness 69 yo female with recent numbness B hands that disappeared was then admitted with R side weakness and speech slurring.  Had also initially been shaky and light headed, now resolved.  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 Note also AST elevation, obesity dx., hypertensive urgency.  PMHx:  tachycardia, HTN, HLD, heart murmur, carpal tunnel release, EtOH use   Clinical Impression   PTA pt lived with her daughter, independent in all ADL, IADL, and mobility tasks. Pt does not ambulate with an AD and reports 0 falls in the last 6 months. Pt still drives. Pt currently independent to min guard for self-care and mobility tasks. Pt able to ambulate to/from bathroom and around unit with RW and min guard. Noted 0 instances of LOB throughout. Pt completed toileting task with min guard and tolerated standing ~6 min at the sink to complete grooming tasks. No signs/symptoms of distress. Pt demonstrates decreased endurance, balance, standing tolerance, and activity tolerance impacting ability to complete self-care and functional transfer tasks. Recommend skilled OT services to address above deficits in order to promote function and prevent further decline. Recommend HH OT for continued rehab prior to discharge home.     Follow Up Recommendations  Home health OT;Supervision/Assistance - 24 hour    Equipment Recommendations  3 in 1 bedside commode(for use in shower)    Recommendations for Other Services       Precautions / Restrictions Precautions Precautions: Fall Restrictions Weight Bearing Restrictions: No      Mobility Bed Mobility Overal bed mobility: Needs Assistance Bed Mobility: Supine to Sit;Sit to Supine     Supine to sit: Supervision;HOB elevated Sit to supine: Supervision;HOB elevated   General bed mobility comments:  To ensure balance and safety  Transfers Overall transfer level: Needs assistance Equipment used: Rolling walker (2 wheeled) Transfers: Sit to/from Stand Sit to Stand: Min guard         General transfer comment: to ensure balance and safety    Balance Overall balance assessment: Needs assistance Sitting-balance support: Feet supported Sitting balance-Leahy Scale: Good       Standing balance-Leahy Scale: Fair                             ADL either performed or assessed with clinical judgement   ADL Overall ADL's : Needs assistance/impaired Eating/Feeding: Independent;Bed level   Grooming: Wash/dry hands;Wash/dry face;Oral care;Min guard;Standing Grooming Details (indicate cue type and reason): While standing at the sink Upper Body Bathing: Supervision/ safety;Set up;Sitting   Lower Body Bathing: Supervison/ safety;Min guard;Sit to/from stand   Upper Body Dressing : Set up;Supervision/safety;Sitting   Lower Body Dressing: Supervision/safety;Min guard;Sit to/from stand   Toilet Transfer: Min guard;Ambulation;Regular Toilet;Grab bars   Toileting- Clothing Manipulation and Hygiene: Min guard;Sit to/from stand       Functional mobility during ADLs: Min guard;Rolling walker General ADL Comments: Pt able to ambulate to/from bathroom and around unit with RW and min guard. Noted 0 instances of LOB.      Vision Baseline Vision/History: Wears glasses Wears Glasses: Reading only Patient Visual Report: No change from baseline Vision Assessment?: Yes Eye Alignment: Within Functional Limits Ocular Range of Motion: Within Functional Limits Alignment/Gaze Preference: Within Defined Limits Tracking/Visual Pursuits: Able to track stimulus in all quads without difficulty Convergence: Within functional limits Visual Fields: No apparent deficits  Additional Comments: Peripheral vision intact     Perception     Praxis      Pertinent Vitals/Pain Pain Assessment:  No/denies pain     Hand Dominance Right   Extremity/Trunk Assessment Upper Extremity Assessment Upper Extremity Assessment: RUE deficits/detail(BUE ROM WFL. Equal strength. Sensation intact) RUE Coordination: decreased fine motor;decreased gross motor(mild)   Lower Extremity Assessment Lower Extremity Assessment: Defer to PT evaluation       Communication Communication Communication: No difficulties   Cognition Arousal/Alertness: Awake/alert Behavior During Therapy: WFL for tasks assessed/performed Overall Cognitive Status: Within Functional Limits for tasks assessed                                 General Comments: Pt pleasant and willing to participate in therapy tasks.    General Comments  No signs/symptoms of distress    Exercises     Shoulder Instructions      Home Living Family/patient expects to be discharged to:: Private residence Living Arrangements: Children Available Help at Discharge: Family;Available PRN/intermittently Type of Home: House Home Access: Stairs to enter [employer] of Steps: 2   Home Layout: One level     Bathroom Shower/Tub: Producer, television/film/video: Standard     Home Equipment: Environmental consultant - 2 wheels;Crutches   Additional Comments: home alone while daugher works as Engineer, civil (consulting)      Prior Functioning/Environment Level of Independence: Independent        Comments: Independent in all ADL, IADL, and mobility tasks. Pt does not ambulate with an assistive device and reports 0 falls in the last 6 months. Pt still drives.         OT Problem List: Decreased activity tolerance;Impaired balance (sitting and/or standing);Decreased coordination      OT Treatment/Interventions: Self-care/ADL training;Therapeutic exercise;Neuromuscular education;Energy conservation;DME and/or AE instruction;Therapeutic activities;Patient/family education;Balance training    OT Goals(Current goals can be found in the care plan  section) Acute Rehab OT Goals Patient Stated Goal: to get home Time For Goal Achievement: 04/22/20 Potential to Achieve Goals: Good ADL Goals Pt Will Perform Tub/Shower Transfer: Shower transfer;with modified independence;grab bars Additional ADL Goal #1: Pt to complete all ADLs with modified independence and 0 instances of LOB. Additional ADL Goal #2: Pt to complete higher level IADLs (i.e. bed making, item retrieval) with modified independence. Additional ADL Goal #3: Pt to tolerate standing up to 10 min with modified independence, in preparation for ADLs.  OT Frequency: Min 3X/week   Barriers to D/C:            Co-evaluation              AM-PAC OT "6 Clicks" Daily Activity     Outcome Measure Help from another person eating meals?: None Help from another person taking care of personal grooming?: A Little Help from another person toileting, which includes using toliet, bedpan, or urinal?: A Little Help from another person bathing (including washing, rinsing, drying)?: A Little Help from another person to put on and taking off regular upper body clothing?: A Little Help from another person to put on and taking off regular lower body clothing?: A Little 6 Click Score: 19   End of Session Equipment Utilized During Treatment: Gait belt;Rolling walker Nurse Communication: Mobility status  Activity Tolerance: Patient tolerated treatment well Patient left: in bed;with call bell/phone within reach;with bed alarm set  OT Visit Diagnosis: Unsteadiness on feet (R26.81);Muscle weakness (generalized) (M62.81)  Time: 3736-6815 OT Time Calculation (min): 25 min Charges:  OT General Charges $OT Visit: 1 Visit OT Evaluation $OT Eval Low Complexity: 1 Low OT Treatments $Therapeutic Activity: 8-22 mins  Mauri Brooklyn OTR/L [phone]  Mauri Brooklyn 04/08/2020, 10:44 AM

## 2020-04-08 NOTE — Progress Notes (Signed)
Discharge instructions gone over with patient and daughter at bedside. All questions answered. IV removed tele discontinued. Patient clothing returned to patient. Patient transported off unit via wheelchair to go home. Melony Overly, RN

## 2020-04-08 NOTE — Care Management CC44 (Signed)
Condition Code 44 Documentation Completed  Patient Details  Name: Andrea George MRN: [id number] Date of Birth: Jul 01, 1951   Condition Code 44 given:  Yes Patient signature on Condition Code 44 notice:  Yes Documentation of 2 MD's agreement:  Yes Code 44 added to claim:  Yes    Lawerance Sabal, RN 04/08/2020, 3:38 PM

## 2020-04-09 ENCOUNTER — Other Ambulatory Visit: Payer: Self-pay | Admitting: Physician Assistant

## 2020-04-09 ENCOUNTER — Telehealth: Payer: Self-pay | Admitting: Physician Assistant

## 2020-04-09 DIAGNOSIS — I639 Cerebral infarction, unspecified: Secondary | ICD-10-CM

## 2020-04-09 NOTE — Telephone Encounter (Signed)
New Message   [facility] from [facility] requesting orders  Home health Physical therapy 2 weeks 3  Then 1 week 5    Please advise

## 2020-04-09 NOTE — Telephone Encounter (Signed)
[facility] RN is calling to reach out for verbal orders on this pt for Physical Therapy/Speech therapy  Jerilnn RN with [facility] states the pt was recently discharged from the hospital for stroke and is advised to follow-up with our office for an event monitor, and to establish with a Cardiologist here, being she just moved here from [place].  Pts primary discharging diagnosis from the hospital is acute CVA with right sided numbness and some speech difficulties.  Home Health RN is needing verbal orders for PT/ST.  Pt will be seeing Neurology soon for recent CVA and will have an event monitor placed by [place], to rule out afib.  She will also be establishing with [place] as well, due to recent move from [place] and history.   Cardmaster was to arrange pt follow-up with [place], but did go ahead and ordered for the pt to get her event monitor placed outpatient by [place].  Advised Home Health RN that orders for PT/ST should more than likely come from her Neurologist, being she had a stroke and will need close monitoring and follow-up from them, in regards to appropriate therapy needed post stroke.  Advised the Home Health RN to call Dr. Manson Passey office with Neuro to receive verbal orders.  Jerilnn verbalized understanding and agrees with this plan.

## 2020-04-10 ENCOUNTER — Telehealth: Payer: Self-pay | Admitting: *Deleted

## 2020-04-10 NOTE — Telephone Encounter (Signed)
We are calling to arrange to have a cardiac event monitor shipped to your residence.  Please call [phone] to provide [place] with your local address and contact information.

## 2020-04-11 ENCOUNTER — Telehealth: Payer: Self-pay | Admitting: *Deleted

## 2020-04-11 ENCOUNTER — Encounter: Payer: Self-pay | Admitting: *Deleted

## 2020-04-11 NOTE — Telephone Encounter (Signed)
Error

## 2020-04-11 NOTE — Telephone Encounter (Signed)
[facility] calling to set up a cardiac event monitor.  Please call [phone] to update your demographics and provide your local address so we may have monitor shipped to your residence.

## 2020-04-11 NOTE — Telephone Encounter (Signed)
Patients temporary [place] address was updated in Epic demographics.   Patient will be enrolled for [employer] to ship a 30 day cardiac event monitor to her home. Reviewed instructions with patient and will send a follow up letter with instructions as well.

## 2020-04-19 ENCOUNTER — Ambulatory Visit (INDEPENDENT_AMBULATORY_CARE_PROVIDER_SITE_OTHER): Payer: Medicare Other

## 2020-04-19 DIAGNOSIS — I4891 Unspecified atrial fibrillation: Secondary | ICD-10-CM | POA: Diagnosis not present

## 2020-04-19 DIAGNOSIS — I639 Cerebral infarction, unspecified: Secondary | ICD-10-CM

## 2020-04-19 DIAGNOSIS — R002 Palpitations: Secondary | ICD-10-CM

## 2020-05-08 NOTE — Progress Notes (Signed)
[facility] [address] [place]. [place] [address] [phone]       HOSPITAL FOLLOW UP NOTE  [name] Date of Birth:  [birth year] Medical Record Number:  [id number]   Reason for Referral:  hospital stroke follow up    SUBJECTIVE:   CHIEF COMPLAINT:  Chief Complaint  Patient presents with  . Follow-up    alone ,treatment rm, pt states she is doing well    HPI:   [name] is a 69 y.o. female with history of hypertension, tachycardia, heart murmer, statin intolerance and hypercholesterolemia who presented on 04/07/2020 withacute onset shakiness/lightheadedness/presyncope, speech difficulties and some right-sided numbness.  Stroke work-up revealed small left BG/CR infarct likely due to small vessel disease however given frequent heart palpitation, recommended 30-day cardiac event monitoring outpatient.  Recommended DAPT for 3 weeks and Plavix alone as previously on aspirin.  Hypertensive urgency upon arrival and recommended long-term BP goal normotensive range.  LDL 126 and TG 787 and recommended initiating Zetia and fish oil with history of statin, Repatha and Praluent intolerance and recommend follow-up with lipid clinic outpatient.  Other active problems include advanced age, EtOH use and obesity.  No prior stroke history.  Evaluated by therapy and recommended discharge home with [facility] therapy for ongoing RLE weakness and decreased endurance, balance and activity tolerance.  Stroke: Small left BG/CR infarct - likely due to small vessel disease.  However, given her frequent heart palpitation, will do a 30-day Cardiac event monitoring.  CT Head - Normal head CT. ASPECTS is 10.  MRI head - Small focus of acute/subacute ischemia at the left caudate tail. No hemorrhage or mass effect.   MRA head and neck- Normal intracranial and cervical MRA.  2D Echo - EF 65 - 70%. No cardiac source of emboli identified.   Sars Corona Virus 2 - negative  LDL  126.6, TG 787  HgbA1c - 5.3  UDS - negative  VTE prophylaxis - Lovenox  aspirin 81 mg daily prior to admission, now on aspirin 81 mg daily and clopidogrel 75 mg daily.  Continue DAPT for 3 weeks and then Plavix alone.  Patient counseled to be compliant with her antithrombotic medications  Ongoing aggressive stroke risk factor management  Therapy recommendations:  PT- CIR; OT- HH  Disposition:  HH PT/OT  Today, 05/08/2020, [name] is being seen for hospital follow-up.  She has been stable from a stroke standpoint with complete recovery. Will be completing home health therapy tomorrow as she has recovered. Completed 3 weeks DAPT and continues on Plavix alone without bleeding or bruising.  Continues on Lovaza and Zetia without side effects. Blood pressure today 157/73. She has not established care with PCP as she recently moved to this area from [place]. Currently wearing cardiac monitor which was started on 04/19/20 and plans to wear for 30 days.  She plans on establishing care with cardiology with initial evaluation in the near future.  No concerns at this time.     ROS:   14 system review of systems performed and negative with exception of no complaints  PMH:  Past Medical History:  Diagnosis Date  . Heart murmur   . Hypercholesterolemia   . Hypertension   . Tachycardia     PSH:  Past Surgical History:  Procedure Laterality Date  . CARPAL TUNNEL RELEASE    . SHOULDER SURGERY      Social History:  Social History   Socioeconomic History  . Marital status: Single  Spouse name: Not on file  . Number of children: Not on file  . Years of education: Not on file  . Highest education level: Not on file  Occupational History  . Not on file  Tobacco Use  . Smoking status: Never Smoker  . Smokeless tobacco: Never Used  Substance and Sexual Activity  . Alcohol use: Yes  . Drug use: Never  . Sexual activity: Not on file  Other Topics Concern  . Not on file  Social  History Narrative  . Not on file   Social Determinants of Health   Financial Resource Strain:   . Difficulty of Paying Living Expenses:   Food Insecurity:   . Worried About Programme researcher, broadcasting/film/video in the Last Year:   . Barista in the Last Year:   Transportation Needs:   . Freight forwarder (Medical):   Marland Kitchen Lack of Transportation (Non-Medical):   Physical Activity:   . Days of Exercise per Week:   . Minutes of Exercise per Session:   Stress:   . Feeling of Stress :   Social Connections:   . Frequency of Communication with Friends and Family:   . Frequency of Social Gatherings with Friends and Family:   . Attends Religious Services:   . Active Member of Clubs or Organizations:   . Attends Banker Meetings:   Marland Kitchen Marital Status:   Intimate Partner Violence:   . Fear of Current or Ex-Partner:   . Emotionally Abused:   Marland Kitchen Physically Abused:   . Sexually Abused:     Family History: No family history on file.  Medications:   Current Outpatient Medications on File Prior to Visit  Medication Sig Dispense Refill  . clopidogrel (PLAVIX) 75 MG tablet Take 1 tablet (75 mg total) by mouth daily. 30 tablet 0  . ezetimibe (ZETIA) 10 MG tablet Take 1 tablet (10 mg total) by mouth daily. 30 tablet 0  . metoprolol tartrate (LOPRESSOR) 50 MG tablet Take 50 mg by mouth 2 (two) times daily.    Marland Kitchen omega-3 acid ethyl esters (LOVAZA) 1 g capsule Take 1 capsule (1 g total) by mouth 2 (two) times daily. 60 capsule 0   No current facility-administered medications on file prior to visit.    Allergies:   Allergies  Allergen Reactions  . Evolocumab Anaphylaxis  . Atorvastatin Other (See Comments)  . Statins     Bone pain      OBJECTIVE:  Physical Exam  Vitals:   05/09/20 1320  BP: (!) 157/73  Pulse: 65  Weight: 171 lb (77.6 kg)  Height: 5\' 1"  (1.549 m)   Body mass index is 32.31 kg/m. No exam data present  General: well developed, well nourished,  pleasant  middle-age female, seated, in no evident distress Head: head normocephalic and atraumatic.   Neck: supple with no carotid or supraclavicular bruits Cardiovascular: regular rate and rhythm, no murmurs Musculoskeletal: no deformity Skin:  no rash/petichiae Vascular:  Normal pulses all extremities   Neurologic Exam Mental Status: Awake and fully alert.  Fluent speech and language. Oriented to place and time. Recent and remote memory intact. Attention span, concentration and fund of knowledge appropriate. Mood and affect appropriate.  Cranial Nerves: Fundoscopic exam reveals sharp disc margins. Pupils equal, briskly reactive to light. Extraocular movements full without nystagmus. Visual fields full to confrontation. Hearing intact. Facial sensation intact. Face, tongue, palate moves normally and symmetrically.  Motor: Normal bulk and tone. Normal strength in all  tested extremity muscles. Sensory.: intact to touch , pinprick , position and vibratory sensation.  Coordination: Rapid alternating movements normal in all extremities. Finger-to-nose and heel-to-shin performed accurately bilaterally. Gait and Station: Arises from chair without difficulty. Stance is normal. Gait demonstrates normal stride length and balance Reflexes: 1+ and symmetric. Toes downgoing.     NIHSS  0 Modified Rankin  0      ASSESSMENT/PLAN: Jenalee Trevizo is a 69 y.o. year old female presented with acute onset of shakiness, lightheadedness and presyncope with speech difficulties and right-sided numbness on 04/07/2020 with stroke work-up revealing small left BG/CR infarct likely secondary to small vessel disease.  History of heart palpitation and recommended 30-day cardiac event monitor outpatient.  Vascular risk factors include HTN, HLD with statin and PCSK9 inhibitor intolerance, EtOH use and heart palpitations. Recovered well from a stroke standpoint without residual deficits.    1. Left BG/CR stroke: Continue  clopidogrel 75 mg daily  and Zetia and fish oil for secondary stroke prevention. Maintain strict control of hypertension with blood pressure goal below 130/90, diabetes with hemoglobin A1c goal below 6.5% and cholesterol with LDL cholesterol (bad cholesterol) goal below 70 mg/dL.  I also advised the patient to eat a healthy diet with plenty of whole grains, cereals, fruits and vegetables, exercise regularly with at least 30 minutes of continuous activity daily and maintain ideal body weight. 2. HTN: Stable. Advised importance of establishing care with PCP for ongoing monitoring and management 3. HLD: Continuation of Zetia and fish oil and advised to schedule follow-up with cardiology/lipid clinic for ongoing monitoring and management 4. Heart palpitation: Complete 30-day cardiac event monitor and schedule follow-up visit with cardiology    Follow up in 4 months or call earlier if needed   I spent 45 minutes of face-to-face and non-face-to-face time with patient.  This included previsit chart review, lab review, study review, order entry, electronic health record documentation, patient education regarding recent stroke,  importance of managing stroke risk factors and answered all questions to patient satisfaction     Frann Rider, [facility]  [facility] [address] [place], [place] [address]  Phone [phone] Fax [phone] Note: This document was prepared with digital dictation and possible smart phrase technology. Any transcriptional errors that result from this process are unintentional.

## 2020-05-09 ENCOUNTER — Telehealth: Payer: Self-pay | Admitting: Adult Health

## 2020-05-09 ENCOUNTER — Ambulatory Visit (INDEPENDENT_AMBULATORY_CARE_PROVIDER_SITE_OTHER): Payer: Medicare Other | Admitting: Adult Health

## 2020-05-09 ENCOUNTER — Encounter: Payer: Self-pay | Admitting: Adult Health

## 2020-05-09 ENCOUNTER — Other Ambulatory Visit: Payer: Self-pay

## 2020-05-09 VITALS — BP 157/73 | HR 65 | Ht 61.0 in | Wt 171.0 lb

## 2020-05-09 DIAGNOSIS — I10 Essential (primary) hypertension: Secondary | ICD-10-CM

## 2020-05-09 DIAGNOSIS — I6381 Other cerebral infarction due to occlusion or stenosis of small artery: Secondary | ICD-10-CM

## 2020-05-09 DIAGNOSIS — E785 Hyperlipidemia, unspecified: Secondary | ICD-10-CM | POA: Diagnosis not present

## 2020-05-09 DIAGNOSIS — I639 Cerebral infarction, unspecified: Secondary | ICD-10-CM | POA: Diagnosis not present

## 2020-05-09 MED ORDER — CLOPIDOGREL BISULFATE 75 MG PO TABS: 75.0000 mg | ORAL_TABLET | Freq: Every day | ORAL | 1 refills | Status: DC

## 2020-05-09 MED ORDER — OMEGA-3-ACID ETHYL ESTERS 1 G PO CAPS: 1.0000 g | ORAL_CAPSULE | Freq: Two times a day (BID) | ORAL | 1 refills | Status: DC

## 2020-05-09 MED ORDER — EZETIMIBE 10 MG PO TABS: 10.0000 mg | ORAL_TABLET | Freq: Every day | ORAL | 1 refills | Status: DC

## 2020-05-09 NOTE — Telephone Encounter (Signed)
Spoke to pt and relayed that did fill with one refill to [facility].  She appreciated call.

## 2020-05-09 NOTE — Patient Instructions (Addendum)
Continue clopidogrel 75 mg daily  and Lovaza and Zetia for secondary stroke prevention  Continue to follow up with PCP regarding cholesterol and blood pressure management   Contact cardiology [address] office to establish care as well as management of cholesterol  Establish care with primary care provider - one place you may call is [facility] Dr. Beverely Low to see if they are accepting new patients as you live close to them. Otherwise, you can go on to [place] website and search for other providers in your area  As you have recovered well from a stroke standpoint, you are cleared to return back to all prior activities including driving. Would recommend waiting at least 3 months until you fly any long distance just in case   Continue to monitor blood pressure at home  Maintain strict control of hypertension with blood pressure goal below 130/90, diabetes with hemoglobin A1c goal below 6.5% and cholesterol with LDL cholesterol (bad cholesterol) goal below 70 mg/dL. I also advised the patient to eat a healthy diet with plenty of whole grains, cereals, fruits and vegetables, exercise regularly and maintain ideal body weight.  Followup in the future with me in 4 months or call earlier if needed      Thank you for coming to see [place] at [facility]. I hope we have been able to provide you high quality care today.  You may receive a patient satisfaction survey over the next few weeks. We would appreciate your feedback and comments so that we may continue to improve ourselves and the health of our patients.

## 2020-05-09 NOTE — Telephone Encounter (Signed)
When she was checking out she said she is out of all 3 of the prescriptions she is supposed to be continuing with and needs a refill, she doesn't have any pills to take today. [facility] and asked if she could get a call when the refill is going to be called in.

## 2020-05-09 NOTE — Addendum Note (Signed)
Addended by: Guy Begin on: 05/09/2020 05:17 PM   Modules accepted: Orders

## 2020-05-09 NOTE — Telephone Encounter (Signed)
Please provide refills for short duration as she is currently in the process of establishing care with PCP.  Thank you.

## 2020-05-10 NOTE — Progress Notes (Signed)
I agree with the above plan 

## 2020-05-22 ENCOUNTER — Other Ambulatory Visit: Payer: Self-pay | Admitting: Physician Assistant

## 2020-05-22 DIAGNOSIS — R002 Palpitations: Secondary | ICD-10-CM

## 2020-05-22 DIAGNOSIS — I4891 Unspecified atrial fibrillation: Secondary | ICD-10-CM

## 2020-05-22 DIAGNOSIS — I639 Cerebral infarction, unspecified: Secondary | ICD-10-CM

## 2020-06-06 DIAGNOSIS — Z7189 Other specified counseling: Secondary | ICD-10-CM | POA: Insufficient documentation

## 2020-06-06 DIAGNOSIS — I350 Nonrheumatic aortic (valve) stenosis: Secondary | ICD-10-CM | POA: Insufficient documentation

## 2020-06-06 NOTE — Progress Notes (Signed)
Cardiology Office Note   Date:  06/07/2020   ID:  Andrea George, DOB 04-05-1951, MRN [id number]  PCP:  Patient, No Pcp Per  Cardiologist:   No primary care provider on file.  Chief Complaint  Patient presents with  . Joint Pain  . Dysphagia      History of Present Illness: Andrea George is a 69 y.o. female who presents for follow-up of a CVA.   The patient had acute and subacute ischemia in the left caudate tail.  Echocardiogram was unremarkable without embolic source.  Neurology thought this was likely secondary to small vessel disease.  30-day event monitor was requested to rule out atrial fibrillation.  She was sent home on aspirin and Plavix.  She does have palpitations.  She had some mild aortic stenosis and regurgitation on an echo.  I reviewed these records for this visit.   She did wear a monitor and had a 5 beat run of V. tach but no evidence of atrial fibrillation.   She did see a cardiologist she says in [place].  She sounds like she was being managed for some mild aortic stenosis.  I do not know that she had any evidence of vascular disease previously.  Does have dyslipidemia.  She has not had any chest discomfort, neck or arm discomfort.  Has not had any palpitations, presyncope or syncope.  Since her stroke she has had some diffuse muscle aches that she ascribes to the Zetia.  She has some other complaints of for instance her throat tightening up.  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   Past Medical History:  Diagnosis Date  . Aortic stenosis   . Hypercholesterolemia   . Hypertension   . Tachycardia     Past Surgical History:  Procedure Laterality Date  . CARPAL TUNNEL RELEASE    . SHOULDER SURGERY       Current Outpatient Medications  Medication Sig Dispense Refill  . clopidogrel (PLAVIX) 75 MG tablet Take 1 tablet (75 mg total) by mouth daily. 90 tablet 3  . metoprolol tartrate (LOPRESSOR) 50 MG tablet Take 50 mg by mouth 2  (two) times daily.    Marland Kitchen omega-3 acid ethyl esters (LOVAZA) 1 g capsule Take 1 capsule (1 g total) by mouth 2 (two) times daily. 180 capsule 3   No current facility-administered medications for this visit.    Allergies:   Evolocumab, Atorvastatin, and Statins    Social History:  The patient  reports that she has never smoked. She has never used smokeless tobacco. She reports current alcohol use. She reports that she does not use drugs.   Family History:  The patient's family history includes HIV/AIDS in her father; Heart attack (age of onset: 4) in her mother; Multiple sclerosis in her brother.    ROS:  Please see the history of present illness.   Otherwise, review of systems are positive for none.   All other systems are reviewed and negative.    PHYSICAL EXAM: VS:  BP (!) 154/90 (BP Location: Right Arm, Patient Position: Sitting, Cuff Size: Normal)   Pulse 71   Temp (!) 97.2 F (36.2 C)   Ht 5\' 1"  (1.549 m)   Wt 173 lb (78.5 kg)   BMI 32.69 kg/m  , BMI Body mass index is 32.69 kg/m. GENERAL:  Well appearing HEENT:  Pupils equal round and reactive, fundi not visualized, oral mucosa unremarkable NECK:  No jugular venous distention, waveform within  normal limits, carotid upstroke brisk and symmetric, no bruits, no thyromegaly LYMPHATICS:  No cervical, inguinal adenopathy LUNGS:  Clear to auscultation bilaterally BACK:  No CVA tenderness CHEST:  Unremarkable HEART:  PMI not displaced or sustained,S1 and S2 within normal limits, no S3, no S4, no clicks, no rubs, very soft apical systolic murmur early peaking, radiating slightly at the aortic outflow tract, no diastolic murmurs ABD:  Flat, positive bowel sounds normal in frequency in pitch, no bruits, no rebound, no guarding, no midline pulsatile mass, no hepatomegaly, no splenomegaly EXT:  2 plus pulses throughout, no edema, no cyanosis no clubbing SKIN:  No rashes no nodules NEURO:  Cranial nerves II through XII grossly intact,  motor grossly intact throughout PSYCH:  Cognitively intact, oriented to person place and time    EKG:  EKG is ordered today. The ekg ordered today demonstrates sinus rhythm, rate 71, axis within normal limits, intervals within normal limits, lateral T wave inversions unchanged from previous.   Recent Labs: 04/07/2020: ALT 36; BUN 14; Creatinine, Ser 0.80; Hemoglobin 13.9; Magnesium 2.2; Platelets 172; Potassium 3.7; Sodium 137    Lipid Panel    Component Value Date/Time   CHOL 312 (H) 04/07/2020 1029   TRIG 787 (H) 04/07/2020 1029   HDL 57 04/07/2020 1029   CHOLHDL 5.5 04/07/2020 1029   VLDL UNABLE TO CALCULATE IF TRIGLYCERIDE OVER 400 mg/dL 11/91/4782 9562   LDLCALC UNABLE TO CALCULATE IF TRIGLYCERIDE OVER 400 mg/dL 13/07/6577 4696   LDLDIRECT 126.6 (H) 04/07/2020 1029      Wt Readings from Last 3 Encounters:  06/07/20 173 lb (78.5 kg)  05/09/20 171 lb (77.6 kg)  04/07/20 165 lb 5.5 oz (75 kg)      Other studies Reviewed: Additional studies/ records that were reviewed today include: Hospital records.  Review of the above records demonstrates:  Please see elsewhere in the note.     ASSESSMENT AND PLAN:  Dyslipidemia: She has been intolerant to statins and PCSK9 inhibitors.  She now think she is intolerant of Zetia and I have asked her to just hold this.. I think given the evidence of vascular disease it is important to see if she can tolerate anything.  I am but asked her to come back and see Dr. Rennis Golden who might consider possibly bempedoic acid versus any enrollment in clinical trials that might be applicable.  Aortic stenosis: She has mild AS and AI.  I will follow this clinically.   I do note that she had an echo in 2018 in [place] with mild AS.  CVA: She had no evidence of atrial fibrillation on her monitor.  I reviewed the MRI and it was thought that this was small vessel disease which makes risk reduction paramount to include lipid management, hypertension management.   We talked at great length about diet and I suggested 150 minutes of exercise.  I am going to try to be controlling her risk factors noted elsewhere.  Of note she does have an apple watch and we reviewed today how to follow this to screen for A. fib.  HTN: Her blood pressure is elevated today but she thinks is not at home.  She is going to keep a blood pressure diary and return those results to me.  ABNORMAL EKG: She is not having any chest pain.  When she is further removed from the stroke I might screen her with a treadmill test.  I do not have any distant EKGs for comparison.   Current medicines  are reviewed at length with the patient today.  The patient does not have concerns regarding medicines.  The following changes have been made:  no change  Labs/ tests ordered today include:   Orders Placed This Encounter  Procedures  . AMB Referral to [facility] Lip[facility] Disorders Clinic  . EKG 12-Lead     Disposition:   FU with me in three months.      Signed, Rollene Rotunda, MD  06/07/2020 1:22 PM    [facility]

## 2020-06-07 ENCOUNTER — Encounter: Payer: Self-pay | Admitting: Cardiology

## 2020-06-07 ENCOUNTER — Ambulatory Visit (INDEPENDENT_AMBULATORY_CARE_PROVIDER_SITE_OTHER): Payer: Medicare Other | Admitting: Cardiology

## 2020-06-07 ENCOUNTER — Other Ambulatory Visit: Payer: Self-pay

## 2020-06-07 VITALS — BP 154/90 | HR 71 | Temp 97.2°F | Ht 61.0 in | Wt 173.0 lb

## 2020-06-07 DIAGNOSIS — I639 Cerebral infarction, unspecified: Secondary | ICD-10-CM | POA: Diagnosis not present

## 2020-06-07 DIAGNOSIS — I350 Nonrheumatic aortic (valve) stenosis: Secondary | ICD-10-CM

## 2020-06-07 DIAGNOSIS — E785 Hyperlipidemia, unspecified: Secondary | ICD-10-CM

## 2020-06-07 MED ORDER — OMEGA-3-ACID ETHYL ESTERS 1 G PO CAPS: 1.0000 g | ORAL_CAPSULE | Freq: Two times a day (BID) | ORAL | 3 refills | Status: DC

## 2020-06-07 MED ORDER — CLOPIDOGREL BISULFATE 75 MG PO TABS: 75.0000 mg | ORAL_TABLET | Freq: Every day | ORAL | 3 refills | Status: AC

## 2020-06-07 NOTE — Patient Instructions (Signed)
Medication Instructions:  STOP YOUR ZETIA *If you need a refill on your cardiac medications before your next appointment, please call your pharmacy*  Follow-Up: At [facility], you and your health needs are our priority.  As part of our continuing mission to provide you with exceptional heart care, we have created designated Provider Care Teams.  These Care Teams include your primary Cardiologist (physician) and Advanced Practice Providers (APPs -  Physician Assistants and Nurse Practitioners) who all work together to provide you with the care you need, when you need it.  We recommend signing up for the patient portal called "MyChart".  Sign up information is provided on this After Visit Summary.  MyChart is used to connect with patients for Virtual Visits (Telemedicine).  Patients are able to view lab/test results, encounter notes, upcoming appointments, etc.  Non-urgent messages can be sent to your provider as well.   To learn more about what you can do with MyChart, go to [email or web address].    Your next appointment:   2 week(s)  The format for your next appointment:   In Person  Provider:   K. Italy Hilty, MD   FOLLOW UP:  WITH DR. HOCHREIN IN 3 MONTHS   Other Instructions AMBULATORY REFERRAL TO LIPID CLINIC- DR.HILTY  OMRON

## 2020-06-25 ENCOUNTER — Telehealth: Payer: Self-pay

## 2020-06-25 NOTE — Telephone Encounter (Signed)
Patient is requesting a refill of her Plavix and Lovaza. She mentioned that she is aware that Shanda Bumps wanted her to start getting them filled by a PCP, but she still has not been able to get in with a PCP. She is going out of town for 2 weeks and doesn't want to be without them. Pharmacy confirmed.

## 2020-06-25 NOTE — Telephone Encounter (Signed)
LMVM for pt that the lovaza, and plavix was refilled by Dr. Antoine Poche, cardiologist on 06-07-20 (90 day supplies and 3 refills) to [facility].  She is to call back if questions.

## 2020-07-03 ENCOUNTER — Ambulatory Visit: Payer: Medicare Other | Admitting: Internal Medicine

## 2020-09-04 ENCOUNTER — Telehealth: Payer: Self-pay

## 2020-09-04 ENCOUNTER — Telehealth (INDEPENDENT_AMBULATORY_CARE_PROVIDER_SITE_OTHER): Payer: Medicare Other | Admitting: Internal Medicine

## 2020-09-04 ENCOUNTER — Encounter: Payer: Self-pay | Admitting: Internal Medicine

## 2020-09-04 VITALS — HR 70 | Ht 61.0 in | Wt 169.0 lb

## 2020-09-04 DIAGNOSIS — E785 Hyperlipidemia, unspecified: Secondary | ICD-10-CM | POA: Diagnosis not present

## 2020-09-04 DIAGNOSIS — M791 Myalgia, unspecified site: Secondary | ICD-10-CM | POA: Diagnosis not present

## 2020-09-04 DIAGNOSIS — Z8673 Personal history of transient ischemic attack (TIA), and cerebral infarction without residual deficits: Secondary | ICD-10-CM | POA: Diagnosis not present

## 2020-09-04 DIAGNOSIS — E781 Pure hyperglyceridemia: Secondary | ICD-10-CM

## 2020-09-04 DIAGNOSIS — I639 Cerebral infarction, unspecified: Secondary | ICD-10-CM | POA: Diagnosis not present

## 2020-09-04 DIAGNOSIS — T466X5A Adverse effect of antihyperlipidemic and antiarteriosclerotic drugs, initial encounter: Secondary | ICD-10-CM

## 2020-09-04 MED ORDER — ICOSAPENT ETHYL 1 G PO CAPS
1.0000 g | ORAL_CAPSULE | Freq: Two times a day (BID) | ORAL | 3 refills | Status: DC
Start: 2020-09-04 — End: 2020-09-06

## 2020-09-04 NOTE — Telephone Encounter (Signed)
Called patient to discuss AVS instructions gave Dr. Blanchie Dessert recommendations and patient voiced understanding. AVS summary mailed to patient along with lab slip(s).

## 2020-09-04 NOTE — Progress Notes (Signed)
Virtual Visit via Telephone Note   This visit type was conducted due to national recommendations for restrictions regarding the COVID-19 Pandemic (e.g. social distancing) in an effort to limit this patient's exposure and mitigate transmission in our community.  Due to her co-morbid illnesses, this patient is at least at moderate risk for complications without adequate follow up.  This format is felt to be most appropriate for this patient at this time.  The patient did not have access to video technology/had technical difficulties with video requiring transitioning to audio format only (telephone).  All issues noted in this document were discussed and addressed.  No physical exam could be performed with this format.  Please refer to the patient's chart for her  consent to telehealth for [facility].   Date:  09/04/2020   ID:  Andrea George, DOB [birth year], MRN [id number] The patient was identified using 2 identifiers.  Evaluation Performed:  New Patient Evaluation  Patient Location:  [address] [place] [address]  Provider location:   [address] [place], [place] [address]  PCP:  Patient, No Pcp Per  Cardiologist:  No primary care provider on file. Electrophysiologist:  None   Chief Complaint:  Manage dyslipidemia  History of Present Illness:    Andrea George is a 69 y.o. female who presents via audio/video conferencing for a telehealth visit today.  This is a pleasant 69 year old female who recently moved to [facility] from [place], with a past history significant for dyslipidemia, high triglycerides and family history of heart disease including cardiac death in her mother at age 57.  Shortly after moving here she had a stroke because right-sided weakness and slurred speech.  MRI demonstrated small focus of acute or subacute ischemia in the left caudate tail.  She did wear a monitor which showed no evidence of atrial fibrillation.  She was placed on  aspirin and Plavix for 3 weeks then Plavix alone.  Unfortunately she has been statin intolerant having had significant myalgias and bone pain.  She was also tried by her cardiologist in [place] on Rep[place] and [employer], both caused her difficulty swallowing and throat pain which resolved after discontinuing it.  She was recently placed on Lovaza and ezetimibe, but may be also intolerant to ezetimibe and has discontinued that.  She was given samples of Nexlizet by lipid specialist in [place] but did not continue with that.  She is now referred today for management options.  [name] reports longstanding high triglycerides.  In addition she has a known history of aortic stenosis and aortic insufficiency.  An echo in May of this year also showed mild aortic insufficiency and aortic stenosis.  The patient does not have symptoms concerning for COVID-19 infection (fever, chills, cough, or new SHORTNESS OF BREATH).    Prior CV studies:   The following studies were reviewed today:  Chart reviewed  PMHx:  Past Medical History:  Diagnosis Date  . Aortic stenosis   . Hypercholesterolemia   . Hypertension   . Tachycardia     Past Surgical History:  Procedure Laterality Date  . CARPAL TUNNEL RELEASE    . SHOULDER SURGERY      FAMHx:  Family History  Problem Relation Age of Onset  . Heart attack Mother 44  . HIV/AIDS Father   . Multiple sclerosis Brother     SOCHx:   reports that she has never smoked. She has never used smokeless tobacco. She reports current alcohol use. She reports that  she does not use drugs.  ALLERGIES:  Allergies  Allergen Reactions  . Evolocumab Anaphylaxis  . Atorvastatin Other (See Comments)  . Statins     Bone pain    MEDS:  Current Meds  Medication Sig  . clopidogrel (PLAVIX) 75 MG tablet Take 1 tablet (75 mg total) by mouth daily.  . metoprolol tartrate (LOPRESSOR) 50 MG tablet Take 50 mg by mouth 2 (two) times daily.  Marland Kitchen omega-3 acid ethyl esters  (LOVAZA) 1 g capsule Take 1 capsule (1 g total) by mouth 2 (two) times daily.     ROS: Pertinent items noted in HPI and remainder of comprehensive ROS otherwise negative.  Labs/Other Tests and Data Reviewed:    Recent Labs: 04/07/2020: ALT 36; BUN 14; Creatinine, Ser 0.80; Hemoglobin 13.9; Magnesium 2.2; Platelets 172; Potassium 3.7; Sodium 137   Recent Lipid Panel Lab Results  Component Value Date/Time   CHOL 312 (H) 04/07/2020 10:29 AM   TRIG 787 (H) 04/07/2020 10:29 AM   HDL 57 04/07/2020 10:29 AM   CHOLHDL 5.5 04/07/2020 10:29 AM   LDLCALC UNABLE TO CALCULATE IF TRIGLYCERIDE OVER 400 mg/dL 35/45/6256 38:93 AM   LDLDIRECT 126.6 (H) 04/07/2020 10:29 AM    Wt Readings from Last 3 Encounters:  09/04/20 169 lb (76.7 kg)  06/07/20 173 lb (78.5 kg)  05/09/20 171 lb (77.6 kg)     Exam:    Vital Signs:  Pulse 70   Ht 5\' 1"  (1.549 m)   Wt 169 lb (76.7 kg)   SpO2 97%   BMI 31.93 kg/m    Exam not performed due to telephone visit  ASSESSMENT & PLAN:    1. Recent stroke 2. Mixed dyslipidemia with high triglycerides 3. Statin, PCSK9 inhibitor and ezetimibe intolerant 4. Family history of premature coronary disease  [name] had a recent stroke and has a marked dyslipidemia with high triglycerides.  Her labs were in May and I would recommend updating them.  She has been on generic Lovaza which shows no cardiovascular risk reduction or more importantly no stroke risk reduction as was seen in the REDUCE-IT trial with Vascepa.  Of course this was on background therapy with statins however I do think it is the best option for her.  We will try to get prior authorization for this and hopefully it will be affordable for her.  Beyond that, we might consider bempedoic acid.  It sounds like she took the combination bempedoic acid and ezetimibe which may have been poorly tolerated and recently could not tolerate ezetimibe.  The other option that may be available at this point would be  enrollment in the Orion-4 trial.  Although this is randomized she could receive inclisiran, the siRNA to PCSK9 which is extremely well-tolerated and would provide 50% reduction in LDL to target her goal less than 70 mg/dL.  I will see if she may be a candidate for the trial.  Finally, there are other clinical trial medications including the apoCIII inhibitor that we are studying and she may qualify for an add-on study for that next year. Will also obtain apoB and LP(a) levels now as well - I'm concerned she may have a high LP(a) since it is associated with premature CAD/stroke, aortic stenosis and arterial/venous thrombosis.  Thanks for this interesting referral.  COVID-19 Education: The signs and symptoms of COVID-19 were discussed with the patient and how to seek care for testing (follow up with PCP or arrange E-visit).  The importance of social distancing was  discussed today.  Patient Risk:   After full review of this patients clinical status, I feel that they are at least moderate risk at this time.  Time:   Today, I have spent 25 minutes with the patient with telehealth technology discussing dyslipidemia, medication intolerance, risk of recurrent stroke, current treatment options and clinical trial options.     Medication Adjustments/Labs and Tests Ordered: Current medicines are reviewed at length with the patient today.  Concerns regarding medicines are outlined above.   Tests Ordered: No orders of the defined types were placed in this encounter.   Medication Changes: No orders of the defined types were placed in this encounter.   Disposition:  in 3 month(s)  Chrystie Nose, MD, [facility], FACP  [place]  [facility]  Medical Director of the Advanced Lipid Disorders &  Cardiovascular Risk Reduction Clinic Diplomate of the American Board of Clinical Lipidology Attending Cardiologist  Direct Dial: [phone]  Fax: [phone]  Website:  [email or web address]  Chrystie Nose, MD  09/04/2020 8:59 AM

## 2020-09-04 NOTE — Patient Instructions (Addendum)
Medication Instructions:   STOP Lovaza  START Vascepa  *If you need a refill on your cardiac medications before your next appointment, please call your pharmacy*  Lab Work: Your physician recommends that you return for lab work in: 1 week  Fasting Lipid Panel-DO NOT EAT OR DRINK PAST MIDNIGHT. OKAY TO HAVE WATER  Apo B  LP If you have labs (blood work) drawn today and your tests are completely normal, you will receive your results only by: Marland Kitchen MyChart Message (if you have MyChart) OR . A paper copy in the mail If you have any lab test that is abnormal or we need to change your treatment, we will call you to review the results.  Testing/Procedures: NONE ordered at this time of appointment   Follow-Up: At [facility], you and your health needs are our priority.  As part of our continuing mission to provide you with exceptional heart care, we have created designated Provider Care Teams.  These Care Teams include your primary Cardiologist (physician) and Advanced Practice Providers (APPs -  Physician Assistants and Nurse Practitioners) who all work together to provide you with the care you need, when you need it.   Your next appointment:   3 month(s)  The format for your next appointment:   In Person  Provider:   K. Italy Hilty, MD  Other Instructions

## 2020-09-04 NOTE — Telephone Encounter (Signed)
  Patient Consent for Virtual Visit         Andrea George has provided verbal consent on 09/04/2020 for a virtual visit (video or telephone).   CONSENT FOR VIRTUAL VISIT FOR:  Andrea George  By participating in this virtual visit I agree to the following:  I hereby voluntarily request, consent and authorize [facility] and its employed or contracted physicians, physician assistants, nurse practitioners or other licensed health care professionals (the Practitioner), to provide me with telemedicine health care services (the "Services") as deemed necessary by the treating Practitioner. I acknowledge and consent to receive the Services by the Practitioner via telemedicine. I understand that the telemedicine visit will involve communicating with the Practitioner through live audiovisual communication technology and the disclosure of certain medical information by electronic transmission. I acknowledge that I have been given the opportunity to request an in-person assessment or other available alternative prior to the telemedicine visit and am voluntarily participating in the telemedicine visit.  I understand that I have the right to withhold or withdraw my consent to the use of telemedicine in the course of my care at any time, without affecting my right to future care or treatment, and that the Practitioner or I may terminate the telemedicine visit at any time. I understand that I have the right to inspect all information obtained and/or recorded in the course of the telemedicine visit and may receive copies of available information for a reasonable fee.  I understand that some of the potential risks of receiving the Services via telemedicine include:  Marland Kitchen Delay or interruption in medical evaluation due to technological equipment failure or disruption; . Information transmitted may not be sufficient (e.g. poor resolution of images) to allow for appropriate medical decision making by the Practitioner;  and/or  . In rare instances, security protocols could fail, causing a breach of personal health information.  Furthermore, I acknowledge that it is my responsibility to provide information about my medical history, conditions and care that is complete and accurate to the best of my ability. I acknowledge that Practitioner's advice, recommendations, and/or decision may be based on factors not within their control, such as incomplete or inaccurate data provided by me or distortions of diagnostic images or specimens that may result from electronic transmissions. I understand that the practice of medicine is not an exact science and that Practitioner makes no warranties or guarantees regarding treatment outcomes. I acknowledge that a copy of this consent can be made available to me via my patient portal [facility] MyChart), or I can request a printed copy by calling the office of [facility].    I understand that my insurance will be billed for this visit.   I have read or had this consent read to me. . I understand the contents of this consent, which adequately explains the benefits and risks of the Services being provided via telemedicine.  . I have been provided ample opportunity to ask questions regarding this consent and the Services and have had my questions answered to my satisfaction. . I give my informed consent for the services to be provided through the use of telemedicine in my medical care

## 2020-09-05 ENCOUNTER — Telehealth: Payer: Self-pay | Admitting: Adult Health

## 2020-09-05 NOTE — Telephone Encounter (Signed)
Pt is asking if Jessica,NP can just call her the day of her appointment.  Phone rep explained the benefit of my chart vv, pt declined nor does she want to come into the office.  Please call pt re: her request for a tele visit.

## 2020-09-06 ENCOUNTER — Telehealth: Payer: Self-pay | Admitting: Cardiology

## 2020-09-06 MED ORDER — ICOSAPENT ETHYL 1 G PO CAPS: 2.0000 g | ORAL_CAPSULE | Freq: Two times a day (BID) | ORAL | 3 refills | Status: AC

## 2020-09-06 NOTE — Telephone Encounter (Signed)
Left the pt a message to call the office back. 

## 2020-09-06 NOTE — Telephone Encounter (Signed)
Fu message   Pt called back returning a call .  Please advise   Best number [phone]

## 2020-09-06 NOTE — Telephone Encounter (Signed)
Pt's appt  changed to  Virtual visit per the patients request. Pt dies not have access to her My Chart

## 2020-09-06 NOTE — Telephone Encounter (Signed)
Patient is inquiring about whether she needs to keep appointment currently scheduled for 09/13/20 with Dr. Antoine Poche or reschedule for a date after she has her lab work. Please advise.

## 2020-09-06 NOTE — Addendum Note (Signed)
Addended by: Lindell Spar on: 09/06/2020 01:47 PM   Modules accepted: Orders

## 2020-09-06 NOTE — Telephone Encounter (Signed)
We could cancel this visit and reschedule for six months.

## 2020-09-06 NOTE — Telephone Encounter (Signed)
Pt is calling in to ask Dr. Antoine Poche if it is necessary that she keep her upcoming office visit with him, scheduled for next Thursday 10/7 at 2 pm.  Pt states she just saw Dr. Rennis Golden in lipid clinic, and was started on Vascepa.  Pt had a virtual visit with Dr. Rennis Golden.  Pt states being she just had a visit with Dr. Rennis Golden, will the appt with Dr. Antoine Poche be necessary.  Informed the pt that Dr. Rennis Golden is following her for lipid management, and Dr. Antoine Poche would be seeing her in clinic for follow-up of CVA she had back in July 2021.   Advised the pt to keep her follow-up appt as scheduled with Dr. Antoine Poche.  Pt insist that I still run this by Dr. Antoine Poche. Pt states if Dr. Antoine Poche advises she keep her appt, could it be switched to virtual? Informed the pt that I will route this message to Dr. Antoine Poche and his RN for further advisement and follow-up with the pt thereafter.  Pt verbalized understanding and agrees with this plan.

## 2020-09-10 ENCOUNTER — Other Ambulatory Visit: Payer: Self-pay

## 2020-09-10 ENCOUNTER — Telehealth (INDEPENDENT_AMBULATORY_CARE_PROVIDER_SITE_OTHER): Payer: Medicare Other | Admitting: Adult Health

## 2020-09-10 ENCOUNTER — Encounter: Payer: Self-pay | Admitting: Adult Health

## 2020-09-10 DIAGNOSIS — I6381 Other cerebral infarction due to occlusion or stenosis of small artery: Secondary | ICD-10-CM | POA: Diagnosis not present

## 2020-09-10 DIAGNOSIS — I10 Essential (primary) hypertension: Secondary | ICD-10-CM | POA: Diagnosis not present

## 2020-09-10 DIAGNOSIS — E785 Hyperlipidemia, unspecified: Secondary | ICD-10-CM | POA: Diagnosis not present

## 2020-09-10 NOTE — Progress Notes (Signed)
I agree with the above plan 

## 2020-09-10 NOTE — Telephone Encounter (Signed)
Patient rescheduled 12/24/2020.

## 2020-09-10 NOTE — Progress Notes (Signed)
[facility] [address] [place]. [place] [address] [phone]       STROKE FOLLOW UP NOTE  [name] Date of Birth:  03/13/1951 Medical Record Number:  [id number]   Reason for Referral: stroke follow up   Virtual Visit via Video Note  I connected with Andrea George on 09/10/20 at  1:45 PM EDT by telephone located in personal office at [facility] and verified that I am speaking with the correct person using two identifiers who was located at their own home.   Chase Caller Electronics engineer) scheduled visit per patient request and discussed the limitations of evaluation and management by telemedicine and the availability of in person appointments. The patient expressed understanding and agreed to proceed.   SUBJECTIVE:   CHIEF COMPLAINT:  Chief Complaint  Patient presents with   Cerebrovascular Accident    Cognitive slowing    HPI:   Today, 09/10/2020, Andrea George requested follow-up stroke visit via telephone (she declined in office visit and does not have ability to participate in video visit).  She reports she has been doing well since prior visit without new stroke/TIA symptoms. She does report mental slowing and delayed recall since her stroke but this has been slowly improving. Medication was reviewed and she remains on Plavix and Vascepa for secondary stroke prevention. Continues to follow with cardiology for HLD management and recently switched to Vascepa as she reported difficulty tolerating Zetia. She does report initially experiencing mild low back pain but this has been improving and otherwise denies side effects.  Blood pressure monitored at home which has been stable with yesterdays reading 145/72.  30-day cardiac event monitor completed which was negative for atrial fibrillation.  No further concerns at this time.    History provided for reference purposes only Initial visit 05/08/2020 JM: Andrea George is being  seen for hospital follow-up.  She has been stable from a stroke standpoint with complete recovery. Will be completing home health therapy tomorrow as she has recovered. Completed 3 weeks DAPT and continues on Plavix alone without bleeding or bruising.  Continues on Lovaza and Zetia without side effects. Blood pressure today 157/73. She has not established care with PCP as she recently moved to this area from [place]. Currently wearing cardiac monitor which was started on 04/19/20 and plans to wear for 30 days.  She plans on establishing care with cardiology with initial evaluation in the near future.  No concerns at this time.  Stroke admission 04/07/2020 Andrea George is a 69 y.o. female with history of hypertension, tachycardia, heart murmer, statin intolerance and hypercholesterolemia who presented on 04/07/2020 withacute onset shakiness/lightheadedness/presyncope, speech difficulties and some right-sided numbness.  Stroke work-up revealed small left BG/CR infarct likely due to small vessel disease however given frequent heart palpitation, recommended 30-day cardiac event monitoring outpatient.  Recommended DAPT for 3 weeks and Plavix alone as previously on aspirin.  Hypertensive urgency upon arrival and recommended long-term BP goal normotensive range.  LDL 126 and TG 787 and recommended initiating Zetia and fish oil with history of statin, Repatha and Praluent intolerance and recommend follow-up with lipid clinic outpatient.  Other active problems include advanced age, EtOH use and obesity.  No prior stroke history.  Evaluated by therapy and recommended discharge home with [facility] therapy for ongoing RLE weakness and decreased endurance, balance and activity tolerance.  Stroke: Small left BG/CR infarct - likely due to small vessel disease.  However, given her frequent heart palpitation, will do a 30-day  Cardiac event monitoring.  CT Head - Normal head CT. ASPECTS is 10.  MRI head - Small focus of  acute/subacute ischemia at the left caudate tail. No hemorrhage or mass effect.   MRA head and neck- Normal intracranial and cervical MRA.  2D Echo - EF 65 - 70%. No cardiac source of emboli identified.   Sars Corona Virus 2 - negative  LDL 126.6, TG 787  HgbA1c - 5.3  UDS - negative  VTE prophylaxis - Lovenox  aspirin 81 mg daily prior to admission, now on aspirin 81 mg daily and clopidogrel 75 mg daily.  Continue DAPT for 3 weeks and then Plavix alone.  Patient counseled to be compliant with her antithrombotic medications  Ongoing aggressive stroke risk factor management  Therapy recommendations:  PT- CIR; OT- HH  Disposition:  HH PT/OT    ROS:   14 system review of systems performed and negative with exception of no complaints  PMH:  Past Medical History:  Diagnosis Date   Aortic stenosis    Hypercholesterolemia    Hypertension    Tachycardia     PSH:  Past Surgical History:  Procedure Laterality Date   CARPAL TUNNEL RELEASE     SHOULDER SURGERY      Social History:  Social History   Socioeconomic History   Marital status: Single    Spouse name: Not on file   Number of children: Not on file   Years of education: Not on file   Highest education level: Not on file  Occupational History   Not on file  Tobacco Use   Smoking status: Never Smoker   Smokeless tobacco: Never Used  Substance and Sexual Activity   Alcohol use: Yes   Drug use: Never   Sexual activity: Not on file  Other Topics Concern   Not on file  Social History Narrative   Not on file   Social Determinants of Health   Financial Resource Strain:    Difficulty of Paying Living Expenses: Not on file  Food Insecurity:    Worried About Running Out of Food in the Last Year: Not on file   Ran Out of Food in the Last Year: Not on file  Transportation Needs:    Lack of Transportation (Medical): Not on file   Lack of Transportation (Non-Medical): Not on file    Physical Activity:    Days of Exercise per Week: Not on file   Minutes of Exercise per Session: Not on file  Stress:    Feeling of Stress : Not on file  Social Connections:    Frequency of Communication with Friends and Family: Not on file   Frequency of Social Gatherings with Friends and Family: Not on file   Attends Religious Services: Not on file   Active Member of Clubs or Organizations: Not on file   Attends Banker Meetings: Not on file   Marital Status: Not on file  Intimate Partner Violence:    Fear of Current or Ex-Partner: Not on file   Emotionally Abused: Not on file   Physically Abused: Not on file   Sexually Abused: Not on file    Family History:  Family History  Problem Relation Age of Onset   Heart attack Mother 59   HIV/AIDS Father    Multiple sclerosis Brother     Medications:   Current Outpatient Medications on File Prior to Visit  Medication Sig Dispense Refill   clopidogrel (PLAVIX) 75 MG tablet Take 1 tablet (  75 mg total) by mouth daily. 90 tablet 3   icosapent Ethyl (VASCEPA) 1 g capsule Take 2 capsules (2 g total) by mouth 2 (two) times daily. 360 capsule 3   metoprolol tartrate (LOPRESSOR) 50 MG tablet Take 50 mg by mouth 2 (two) times daily.     No current facility-administered medications on file prior to visit.    Allergies:   Allergies  Allergen Reactions   Evolocumab Anaphylaxis   Atorvastatin Other (See Comments)   Statins     Bone pain      OBJECTIVE: Unable to complete exam due to visit type (telephone)       ASSESSMENT/PLAN: Andrea George is a 69 y.o. year old female presented with acute onset of shakiness, lightheadedness and presyncope with speech difficulties and right-sided numbness on 04/07/2020 with stroke work-up revealing small left BG/CR infarct likely secondary to small vessel disease.  History of heart palpitation and recommended 30-day cardiac event monitor outpatient.  Vascular  risk factors include HTN, HLD with statin and PCSK9 inhibitor intolerance, EtOH use and heart palpitations.     1. Left BG/CR stroke:  a. 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  Discussed memory exercises such as crossword puzzles, sudoku, word search, reading and card games as well as importance of managing stroke risk factors, maintaining a healthy diet and adequate exercise, and healthy sleep hygiene b. continue clopidogrel 75 mg daily  and Vascepa for secondary stroke prevention.   c. Discussed secondary stroke prevention measures and importance of close PCP and cardiology follow-up for aggressive stroke risk factor management 2. HTN: BP goal<130/90.  On metoprolol per cardiology 3. HLD: LDL goal<70.  On Vascepa per cardiology   Overall stable from stroke standpoint and recommend follow-up on an as-needed basis   I spent 25 minutes of non-face-to-face time with patient via telephone.  This included previsit chart review, lab review, study review, order entry, electronic health record documentation, patient education regarding prior stroke, residual cognitive impairment, importance of managing stroke risk factors and answered all questions to patient satisfaction    Ihor Austin, [facility]  [facility] [address] [place], [place] [address]  Phone [phone] Fax [phone] Note: This document was prepared with digital dictation and possible smart phrase technology. Any transcriptional errors that result from this process are unintentional.

## 2020-09-12 ENCOUNTER — Telehealth: Payer: Self-pay | Admitting: Internal Medicine

## 2020-09-12 NOTE — Telephone Encounter (Signed)
[employer] FEP Prior Auth # [id number] ID: [id number]  Contacted [facility] and no PA was required for Vascepa and patient has already picked it up

## 2020-09-13 ENCOUNTER — Ambulatory Visit: Payer: Medicare Other | Admitting: Cardiology

## 2020-09-20 LAB — LIPID PANEL
Chol/HDL Ratio: 6.5 ratio — ABNORMAL HIGH (ref 0.0–4.4)
Cholesterol, Total: 254 mg/dL — ABNORMAL HIGH (ref 100–199)
HDL: 39 mg/dL — ABNORMAL LOW (ref >39)
LDL Chol Calc (NIH): 158 mg/dL — ABNORMAL HIGH (ref 0–99)
Triglycerides: 304 mg/dL — ABNORMAL HIGH (ref 0–149)
VLDL Cholesterol Cal: 57 mg/dL — ABNORMAL HIGH (ref 5–40)

## 2020-09-20 LAB — LIPOPROTEIN A (LPA): Lipoprotein (a): 12.9 nmol/L (ref <75.0)

## 2020-09-20 LAB — APOLIPOPROTEIN B: Apolipoprotein B: 142 mg/dL — ABNORMAL HIGH (ref <90)

## 2020-10-03 ENCOUNTER — Telehealth: Payer: Self-pay | Admitting: Internal Medicine

## 2020-10-03 ENCOUNTER — Encounter: Payer: Self-pay | Admitting: *Deleted

## 2020-10-03 DIAGNOSIS — Z5181 Encounter for therapeutic drug level monitoring: Secondary | ICD-10-CM

## 2020-10-03 DIAGNOSIS — E785 Hyperlipidemia, unspecified: Secondary | ICD-10-CM

## 2020-10-03 DIAGNOSIS — E781 Pure hyperglyceridemia: Secondary | ICD-10-CM

## 2020-10-03 NOTE — Telephone Encounter (Signed)
Labs ordered and will mail to patient  Left message to call back and ask for triage

## 2020-10-03 NOTE — Telephone Encounter (Signed)
Patient would like lab orders mailed to her prior to her next visit.

## 2020-10-03 NOTE — Telephone Encounter (Signed)
Mychart message sent to patient lab orders mailed

## 2020-12-18 ENCOUNTER — Telehealth: Payer: Medicare Other | Admitting: Internal Medicine

## 2020-12-20 ENCOUNTER — Telehealth: Payer: Self-pay | Admitting: Cardiology

## 2020-12-20 NOTE — Telephone Encounter (Signed)
New Message:      Pt is cancelling her visit on 12-24-20. Her nest visit, she wants to know if it can be a Virtual Visit even if  it is not a Virtual Day?

## 2020-12-24 ENCOUNTER — Telehealth: Payer: Medicare Other | Admitting: Cardiology

## 2020-12-31 ENCOUNTER — Telehealth: Payer: Medicare Other | Admitting: Internal Medicine

## 2021-01-18 ENCOUNTER — Encounter: Payer: Self-pay | Admitting: Internal Medicine

## 2021-01-18 ENCOUNTER — Telehealth (INDEPENDENT_AMBULATORY_CARE_PROVIDER_SITE_OTHER): Payer: Medicare Other | Admitting: Internal Medicine

## 2021-01-18 VITALS — Ht 61.0 in | Wt 175.0 lb

## 2021-01-18 DIAGNOSIS — M791 Myalgia, unspecified site: Secondary | ICD-10-CM | POA: Diagnosis not present

## 2021-01-18 DIAGNOSIS — Z8673 Personal history of transient ischemic attack (TIA), and cerebral infarction without residual deficits: Secondary | ICD-10-CM

## 2021-01-18 DIAGNOSIS — T466X5A Adverse effect of antihyperlipidemic and antiarteriosclerotic drugs, initial encounter: Secondary | ICD-10-CM

## 2021-01-18 DIAGNOSIS — E785 Hyperlipidemia, unspecified: Secondary | ICD-10-CM

## 2021-01-18 DIAGNOSIS — I350 Nonrheumatic aortic (valve) stenosis: Secondary | ICD-10-CM

## 2021-01-18 DIAGNOSIS — E781 Pure hyperglyceridemia: Secondary | ICD-10-CM

## 2021-01-18 LAB — LIPID PANEL
Chol/HDL Ratio: 6.5 ratio — ABNORMAL HIGH (ref 0.0–4.4)
Cholesterol, Total: 229 mg/dL — ABNORMAL HIGH (ref 100–199)
HDL: 35 mg/dL — ABNORMAL LOW (ref >39)
LDL Chol Calc (NIH): 144 mg/dL — ABNORMAL HIGH (ref 0–99)
Triglycerides: 272 mg/dL — ABNORMAL HIGH (ref 0–149)
VLDL Cholesterol Cal: 50 mg/dL — ABNORMAL HIGH (ref 5–40)

## 2021-01-18 LAB — APOLIPOPROTEIN B: Apolipoprotein B: 137 mg/dL — ABNORMAL HIGH (ref <90)

## 2021-01-18 LAB — LIPOPROTEIN A (LPA): Lipoprotein (a): 8.7 nmol/L (ref <75.0)

## 2021-01-18 NOTE — Patient Instructions (Signed)
Medication Instructions:  Your Physician recommend you continue on your current medication as directed.     *If you need a refill on your cardiac medications before your next appointment, please call your pharmacy*   Lab Work: None   Testing/Procedures: None   Follow-Up: At [facility], you and your health needs are our priority.  As part of our continuing mission to provide you with exceptional heart care, we have created designated Provider Care Teams.  These Care Teams include your primary Cardiologist (physician) and Advanced Practice Providers (APPs -  Physician Assistants and Nurse Practitioners) who all work together to provide you with the care you need, when you need it.  We recommend signing up for the patient portal called "MyChart".  Sign up information is provided on this After Visit Summary.  MyChart is used to connect with patients for Virtual Visits (Telemedicine).  Patients are able to view lab/test results, encounter notes, upcoming appointments, etc.  Non-urgent messages can be sent to your provider as well.   To learn more about what you can do with MyChart, go to [email or web address].    Your next appointment:   6 month(s)  The format for your next appointment:   In Person  Provider:   K. Italy Hilty, MD

## 2021-01-18 NOTE — Progress Notes (Signed)
Virtual Visit via Telephone Note   This visit type was conducted due to national recommendations for restrictions regarding the COVID-19 Pandemic (e.g. social distancing) in an effort to limit this patient's exposure and mitigate transmission in our community.  Due to her co-morbid illnesses, this patient is at least at moderate risk for complications without adequate follow up.  This format is felt to be most appropriate for this patient at this time.  The patient did not have access to video technology/had technical difficulties with video requiring transitioning to audio format only (telephone).  All issues noted in this document were discussed and addressed.  No physical exam could be performed with this format.  Please refer to the patient's chart for her  consent to telehealth for [facility].   Date:  01/18/2021   ID:  Andrea George, DOB 08-13-1951, MRN [id number] The patient was identified using 2 identifiers.  Evaluation Performed:  New Patient Evaluation  Patient Location:  [address] [place] [address]  Provider location:   [address] [place], [place] [address]  PCP:  Patient, No Pcp Per  Cardiologist:  No primary care provider on file. Electrophysiologist:  None   Chief Complaint:  Manage dyslipidemia  History of Present Illness:    Andrea George is a 70 y.o. female who presents via audio/video conferencing for a telehealth visit today.  This is a pleasant 70 year old female who recently moved to [facility] from [place], with a past history significant for dyslipidemia, high triglycerides and family history of heart disease including cardiac death in her mother at age 4.  Shortly after moving here she had a stroke because right-sided weakness and slurred speech.  MRI demonstrated small focus of acute or subacute ischemia in the left caudate tail.  She did wear a monitor which showed no evidence of atrial fibrillation.  She was placed on  aspirin and Plavix for 3 weeks then Plavix alone.  Unfortunately she has been statin intolerant having had significant myalgias and bone pain.  She was also tried by her cardiologist in [place] on Repatha and P[place], both caused her difficulty swallowing and throat pain which resolved after discontinuing it.  She was recently placed on Lovaza and ezetimibe, but may be also intolerant to ezetimibe and has discontinued that.  She was given samples of Nexlizet by lipid specialist in [place] but did not continue with that.  She is now referred today for management options.  Andrea George reports longstanding high triglycerides.  In addition she has a known history of aortic stenosis and aortic insufficiency.  An echo in May of this year also showed mild aortic insufficiency and aortic stenosis.   01/18/2021  Andrea George returns today for follow-up.  She has had some incremental response to Vascepa.  She wonders if it could be worsening her arthritis however I think that is unlikely.  In fact, many people report improvement in joint pain on fish oils.  She says she needs to try to schedule with her rheumatologist.  She has a history of a psoriatic arthritis and was recommended to go onto Enbrel but never took the medication.  Her total cholesterol has come down from 2 54-2 29, triglycerides 272, HDL 35 and LDL now 144 from 158.  A modest response but certainly better.  She is not on a statin as previously mentioned he could not tolerate both PCSK9 inhibitors.  Ultimately our options are limited.  She took Nexlizet which caused her significant symptoms  and shortly had a stroke thereafter therefore she is hesitant to try even the Nexletol monotherapy.  Ultimately, I think that it would be best to try her on inclisiran Wilber Bihari).   The patient does not have symptoms concerning for COVID-19 infection (fever, chills, cough, or new SHORTNESS OF BREATH).    Prior CV studies:   The following studies were reviewed  today:  Chart reviewed  PMHx:  Past Medical History:  Diagnosis Date  . Aortic stenosis   . Hypercholesterolemia   . Hypertension   . Tachycardia     Past Surgical History:  Procedure Laterality Date  . CARPAL TUNNEL RELEASE    . SHOULDER SURGERY      FAMHx:  Family History  Problem Relation Age of Onset  . Heart attack Mother 42  . HIV/AIDS Father   . Multiple sclerosis Brother     SOCHx:   reports that she has never smoked. She has never used smokeless tobacco. She reports current alcohol use. She reports that she does not use drugs.  ALLERGIES:  Allergies  Allergen Reactions  . Evolocumab Anaphylaxis  . Atorvastatin Other (See Comments)  . Statins     Bone pain    MEDS:  Current Meds  Medication Sig  . clopidogrel (PLAVIX) 75 MG tablet Take 1 tablet (75 mg total) by mouth daily.  Marland Kitchen ibuprofen (ADVIL) 200 MG tablet Take 200 mg by mouth every 6 (six) hours as needed.  Marland Kitchen icosapent Ethyl (VASCEPA) 1 g capsule Take 2 capsules (2 g total) by mouth 2 (two) times daily.  . metoprolol tartrate (LOPRESSOR) 50 MG tablet Take 50 mg by mouth 2 (two) times daily.     ROS: Pertinent items noted in HPI and remainder of comprehensive ROS otherwise negative.  Labs/Other Tests and Data Reviewed:    Recent Labs: 04/07/2020: ALT 36; BUN 14; Creatinine, Ser 0.80; Hemoglobin 13.9; Magnesium 2.2; Platelets 172; Potassium 3.7; Sodium 137   Recent Lipid Panel Lab Results  Component Value Date/Time   CHOL 229 (H) 01/17/2021 08:15 AM   TRIG 272 (H) 01/17/2021 08:15 AM   HDL 35 (L) 01/17/2021 08:15 AM   CHOLHDL 6.5 (H) 01/17/2021 08:15 AM   CHOLHDL 5.5 04/07/2020 10:29 AM   LDLCALC 144 (H) 01/17/2021 08:15 AM   LDLDIRECT 126.6 (H) 04/07/2020 10:29 AM    Wt Readings from Last 3 Encounters:  01/18/21 175 lb (79.4 kg)  09/04/20 169 lb (76.7 kg)  06/07/20 173 lb (78.5 kg)     Exam:    Vital Signs:  Ht 5\' 1"  (1.549 m)   Wt 175 lb (79.4 kg)   BMI 33.07 kg/m    Exam not  performed due to telephone visit  ASSESSMENT & PLAN:    1. Recent stroke 2. Mixed dyslipidemia with high triglycerides 3. Statin, PCSK9 inhibitor and ezetimibe intolerant 4. Family history of premature coronary disease  Andrea George has numerous medication intolerances but seems to be doing better on Vascepa.  She is now on 2 g twice daily and has had a modest improvement in her triglycerides and LDL.  Ultimately we need further LDL reduction and were not likely to achieve that with any other products except for PCSK9 inhibitor.  She could not take the antibody therapies therefore she is a very good candidate for inclisiran.  We are working to try to make that clinically available and I will contact her to discuss it.  She says she will be back and forth to [place] however the  medication is initially administered via an injection, then a repeat injection in 3 months and then 6 months thereafter.  This would likely fit with her schedule as her daughter is likely to remain at [facility] and nursing.  Follow-up in 6 months.  COVID-19 Education: The signs and symptoms of COVID-19 were discussed with the patient and how to seek care for testing (follow up with PCP or arrange E-visit).  The importance of social distancing was discussed today.  Patient Risk:   After full review of this patients clinical status, I feel that they are at least moderate risk at this time.  Time:   Today, I have spent 25 minutes with the patient with telehealth technology discussing dyslipidemia, medication intolerance, risk of recurrent stroke, current treatment options and clinical trial options.     Medication Adjustments/Labs and Tests Ordered: Current medicines are reviewed at length with the patient today.  Concerns regarding medicines are outlined above.   Tests Ordered: No orders of the defined types were placed in this encounter.   Medication Changes: No orders of the defined types were placed in this  encounter.   Disposition:  in 6 month(s)  Chrystie Nose, MD, [facility], FACP  [place]  [facility]  Medical Director of the Advanced Lipid Disorders &  Cardiovascular Risk Reduction Clinic Diplomate of the American Board of Clinical Lipidology Attending Cardiologist  Direct Dial: [phone]  Fax: [phone]  Website:  [email or web address]  Chrystie Nose, MD  01/18/2021 10:00 AM

## 2021-01-27 DIAGNOSIS — I10 Essential (primary) hypertension: Secondary | ICD-10-CM | POA: Insufficient documentation

## 2021-01-27 NOTE — Progress Notes (Signed)
Virtual Visit via Telephone Note   This visit type was conducted due to national recommendations for restrictions regarding the COVID-19 Pandemic (e.g. social distancing) in an effort to limit this patient's exposure and mitigate transmission in our community.  Due to her co-morbid illnesses, this patient is at least at moderate risk for complications without adequate follow up.  This format is felt to be most appropriate for this patient at this time.  The patient did not have access to video technology/had technical difficulties with video requiring transitioning to audio format only (telephone).  All issues noted in this document were discussed and addressed.  No physical exam could be performed with this format.  Please refer to the patient's chart for her  consent to telehealth for [facility].    Date:  01/28/2021   ID:  Andrea George, DOB 02-13-1951, MRN [id number] The patient was identified using 2 identifiers.  Patient Location: Home Provider Location: Home Office   PCP:  Patient, No Pcp Per    [facility]  Cardiologist:  No primary care provider on file.  Advanced Practice Provider:  No care team member to display Electrophysiologist:  None   Evaluation Performed:  Follow-Up Visit  Chief Complaint:  Muscle spasms  History of Present Illness:    Andrea George is a 70 y.o. female who presents for follow-up of a CVA.   The patient had acute and subacute ischemia in the left caudate tail.  Echocardiogram was unremarkable without embolic source.  Neurology thought this was likely secondary to small vessel disease.  30-day event monitor was requested to rule out atrial fibrillation.  She was sent home on aspirin and Plavix.  She does have palpitations.  She had some mild aortic stenosis and regurgitation on an echo. She did wear a monitor and had a 5 beat run of V. tach but no evidence of atrial fibrillation.   He has lots of somatic complaints.  She  describes muscle spasms in her neck.  She describes wheezing.  She has been trying to avoid salt.  She says has a residual from her stroke she has some difficulty reading but she has not had any other motor or speech difficulties.  She occasionally have some palpitations but not any sustained arrhythmias.  She has had occasional fleeting chest discomfort but she is not describing substernal chest pressure, neck or arm discomfort.  She has some wheezing which she notices occasionally at night.  The patient does not have symptoms concerning for COVID-19 infection (fever, chills, cough, or new shortness of breath).    Past Medical History:  Diagnosis Date  . Aortic stenosis   . Hypercholesterolemia   . Hypertension   . Tachycardia    Past Surgical History:  Procedure Laterality Date  . CARPAL TUNNEL RELEASE    . SHOULDER SURGERY       Current Meds  Medication Sig  . clopidogrel (PLAVIX) 75 MG tablet Take 1 tablet (75 mg total) by mouth daily.  Marland Kitchen ibuprofen (ADVIL) 200 MG tablet Take 200 mg by mouth every 6 (six) hours as needed.  Marland Kitchen icosapent Ethyl (VASCEPA) 1 g capsule Take 2 capsules (2 g total) by mouth 2 (two) times daily.  . metoprolol tartrate (LOPRESSOR) 50 MG tablet Take 50 mg by mouth 2 (two) times daily.     Allergies:   Evolocumab, Atorvastatin, and Statins   Social History   Tobacco Use  . Smoking status: Never Smoker  . Smokeless tobacco: Never  Used  Substance Use Topics  . Alcohol use: Yes  . Drug use: Never     Family Hx: The patient's family history includes HIV/AIDS in her father; Heart attack (age of onset: 37) in her mother; Multiple sclerosis in her brother.  ROS:   Please see the history of present illness.    None All other systems reviewed and are negative.   Prior CV studies:   The following studies were reviewed today:  NA  Labs/Other Tests and Data Reviewed:    EKG:  No ECG reviewed.  Recent Labs: 04/07/2020: ALT 36; BUN 14; Creatinine, Ser  0.80; Hemoglobin 13.9; Magnesium 2.2; Platelets 172; Potassium 3.7; Sodium 137   Recent Lipid Panel Lab Results  Component Value Date/Time   CHOL 229 (H) 01/17/2021 08:15 AM   TRIG 272 (H) 01/17/2021 08:15 AM   HDL 35 (L) 01/17/2021 08:15 AM   CHOLHDL 6.5 (H) 01/17/2021 08:15 AM   CHOLHDL 5.5 04/07/2020 10:29 AM   LDLCALC 144 (H) 01/17/2021 08:15 AM   LDLDIRECT 126.6 (H) 04/07/2020 10:29 AM    Wt Readings from Last 3 Encounters:  01/28/21 175 lb (79.4 kg)  01/18/21 175 lb (79.4 kg)  09/04/20 169 lb (76.7 kg)     Risk Assessment/Calculations:      Objective:    Vital Signs:  Ht 5\' 1"  (1.549 m)   Wt 175 lb (79.4 kg)   BMI 33.07 kg/m     ASSESSMENT & PLAN:    Dyslipidemia:  She has been intolerant to statins and PCSK9 inhibitors.  She has been followed by Dr. and is treated with Vascepa.  He is considering her for inclisiran and I sent him a message to inquire how she might be approved for this.  Aortic stenosis: She has mild AS and AI.  I will check up on this in 12 months with a follow-up visit.  CVA:   She had no evidence of arrhythmia on her monitor.  MRI suggested that she had some vascular problems probably related to her lipids and her blood pressure and I reviewed with her the necessity of adequate control.  HTN: Her blood pressure is not able to be checked today as she packed her blood pressure cuff.  She is moving back to [place] but she says she will be back here for follow-up appointments as her daughter is still going to be a nurse here.  I told her that she needs excellent blood pressure control.    Time:   Today, I have spent 25 minutes with the patient with telehealth technology discussing the above problems.     Medication Adjustments/Labs and Tests Ordered: Current medicines are reviewed at length with the patient today.  Concerns regarding medicines are outlined above.   Tests Ordered: No orders of the defined types were placed in this  encounter.   Medication Changes: No orders of the defined types were placed in this encounter.   Follow Up:  In Person In 12 months  Signed, 13, MD  01/28/2021 12:48 PM    [facility]

## 2021-01-28 ENCOUNTER — Encounter: Payer: Self-pay | Admitting: Cardiology

## 2021-01-28 ENCOUNTER — Telehealth (INDEPENDENT_AMBULATORY_CARE_PROVIDER_SITE_OTHER): Payer: Medicare Other | Admitting: Cardiology

## 2021-01-28 VITALS — Ht 61.0 in | Wt 175.0 lb

## 2021-01-28 DIAGNOSIS — I10 Essential (primary) hypertension: Secondary | ICD-10-CM

## 2021-01-28 DIAGNOSIS — I161 Hypertensive emergency: Secondary | ICD-10-CM | POA: Diagnosis not present

## 2021-01-28 DIAGNOSIS — E785 Hyperlipidemia, unspecified: Secondary | ICD-10-CM

## 2021-01-28 DIAGNOSIS — I350 Nonrheumatic aortic (valve) stenosis: Secondary | ICD-10-CM | POA: Diagnosis not present

## 2021-01-28 MED ORDER — METOPROLOL TARTRATE 50 MG PO TABS: 50.0000 mg | ORAL_TABLET | Freq: Two times a day (BID) | ORAL | 3 refills | Status: DC

## 2021-01-28 NOTE — Patient Instructions (Signed)

## 2021-02-12 NOTE — Telephone Encounter (Signed)
Left message for patient to discuss message as noted re: Andrea George

## 2021-05-22 NOTE — Telephone Encounter (Signed)
Copied from staff message: Chrystie Nose, MD  Lindell Spar, RN No .. she had multiple intolerances, if I recall and could not take a PCSK9i, so options are limited   Dr Rexene Edison         Previous Messages    ----- Message -----  From: Lindell Spar, RN  Sent: 04/15/2021   9:33 AM EDT  To: Chrystie Nose, MD  Subject: FW: Candidate for inclisiran                   Since Wilber Bihari is on hold, do you have other plans?   I have reached out to her via MyChart and called her about this but had no response.   She is supposed to have a follow up late summer  ----- Message -----  From: Chrystie Nose, MD  Sent: 01/18/2021  10:03 AM EDT  To: Lindell Spar, RN  Subject: Candidate for inclisiran                       Please keep her name for inclisiran - I think she is a good candidate once we work things out.

## 2021-09-06 ENCOUNTER — Other Ambulatory Visit: Payer: Self-pay | Admitting: Internal Medicine

## 2022-04-17 ENCOUNTER — Other Ambulatory Visit: Payer: Self-pay | Admitting: Cardiology
